# Patient Record
Sex: Male | Born: 1970 | Race: Black or African American | Hispanic: No | Marital: Married | Smoking: Current every day smoker
Health system: Southern US, Community
[De-identification: ages and names within clinical notes are randomized; demographics above are authoritative.]

## PROBLEM LIST (undated history)

## (undated) DIAGNOSIS — T7840XA Allergy, unspecified, initial encounter: Secondary | ICD-10-CM

## (undated) DIAGNOSIS — Z972 Presence of dental prosthetic device (complete) (partial): Secondary | ICD-10-CM

## (undated) DIAGNOSIS — C499 Malignant neoplasm of connective and soft tissue, unspecified: Secondary | ICD-10-CM

## (undated) DIAGNOSIS — I219 Acute myocardial infarction, unspecified: Secondary | ICD-10-CM

## (undated) DIAGNOSIS — C7951 Secondary malignant neoplasm of bone: Secondary | ICD-10-CM

## (undated) DIAGNOSIS — C801 Malignant (primary) neoplasm, unspecified: Secondary | ICD-10-CM

## (undated) DIAGNOSIS — J3489 Other specified disorders of nose and nasal sinuses: Secondary | ICD-10-CM

## (undated) HISTORY — DX: Secondary malignant neoplasm of bone: C79.51

## (undated) HISTORY — DX: Allergy, unspecified, initial encounter: T78.40XA

## (undated) HISTORY — DX: Malignant (primary) neoplasm, unspecified: C80.1

## (undated) HISTORY — DX: Acute myocardial infarction, unspecified: I21.9

## (undated) HISTORY — DX: Malignant neoplasm of connective and soft tissue, unspecified: C49.9

## (undated) HISTORY — PX: TUMOR EXCISION: SHX421

## (undated) HISTORY — PX: MULTIPLE TOOTH EXTRACTIONS: SHX2053

---

## 2002-07-02 ENCOUNTER — Encounter: Payer: Self-pay | Admitting: Emergency Medicine

## 2002-07-02 ENCOUNTER — Ambulatory Visit (HOSPITAL_BASED_OUTPATIENT_CLINIC_OR_DEPARTMENT_OTHER): Admission: RE | Admit: 2002-07-02 | Discharge: 2002-07-02 | Payer: Self-pay | Admitting: Orthopedic Surgery

## 2002-07-02 ENCOUNTER — Emergency Department (HOSPITAL_COMMUNITY): Admission: EM | Admit: 2002-07-02 | Discharge: 2002-07-02 | Payer: Self-pay | Admitting: Emergency Medicine

## 2006-07-22 ENCOUNTER — Emergency Department (HOSPITAL_COMMUNITY): Admission: EM | Admit: 2006-07-22 | Discharge: 2006-07-22 | Payer: Self-pay | Admitting: Emergency Medicine

## 2008-10-22 ENCOUNTER — Emergency Department (HOSPITAL_COMMUNITY): Admission: EM | Admit: 2008-10-22 | Discharge: 2008-10-22 | Payer: Self-pay | Admitting: Emergency Medicine

## 2009-06-12 ENCOUNTER — Emergency Department (HOSPITAL_COMMUNITY): Admission: EM | Admit: 2009-06-12 | Discharge: 2009-06-12 | Payer: Self-pay | Admitting: Family Medicine

## 2010-07-27 NOTE — Op Note (Signed)
NAMESELDON, Bruce Lynch NO.:  [id number]   MEDICAL RECORD NO.:  [id number]                   PATIENT TYPE:  AMB   LOCATION:  DSC                                  FACILITY:  [facility]   PHYSICIAN:  Katy Fitch. Naaman Plummer., M.D.          DATE OF BIRTH:  10-21-1970   DATE OF PROCEDURE:  07/02/2002  DATE OF DISCHARGE:  07/02/2002                                 OPERATIVE REPORT   PREOPERATIVE DIAGNOSIS:  Avulsion of pulp and nail avulsion of left long  finger due to industrial accident.   POSTOPERATIVE DIAGNOSES:  Avulsion of pulp and nail avulsion of left long  finger due to industrial accident, with identification of fracture of tuft  of distal phalanx, left long finger.   PROCEDURES:  Irrigation and debridement of wound, followed by V to Y  advancement flap for coverage of exposed distal phalanx, left long finger.   SURGEON:  Katy Fitch. Sypher, M.D.   ASSISTANT:  Jonni Sanger, P.A.   ANESTHESIA:  0.25% Marcaine and 2% lidocaine metacarpal head-level block.  Anesthesia provided by Katy Fitch. Sypher, M.D.   INDICATIONS:  The patient is a 40 year old who is employed in a  [employer].  Earlier today he caught his left long finger in a  machine and abruptly withdrew the finger, tearing off a portion of his nail  plate, distal pulp, and exposing the distal phalanx of his left long finger.   He was initially seen at [facility], subsequently referred to  the emergency room, and a hand surgery consult was requested.   Due to the immediate availability of the minor operating room at the [facility]  day surgery center, he was subsequently transferred from the emergency room  to the [facility] day surgery center for urgent care of his left long finger.   After informed consent, he is brought to the operating room at this time.   DESCRIPTION OF PROCEDURE:  The patient is brought to the operating room and  placed in the supine position on the  operating table.  Following informed  consent, we placed a 0.25% Marcaine and 2% lidocaine metacarpal head-level  block.  When anesthesia was satisfactory to the left long finger, the arm  was prepped with Betadine soap and solution and sterilely draped.  The left  long finger was exsanguinated with a gauze wrap and a half-inch Penrose  drain was placed at the base of the finger as a digital tourniquet.   The wound was serially debrided of devitalized tissue, foreign material, and  irrigated thoroughly with sterile saline to cleanse the exposed distal  phalangeal tuft.   On the ulnar aspect of the finger there was some skin extending almost to  the ulnar nail fold.  There was a deficit of skin measuring more than 1 cm  on the radial aspect of the pulp.   A V to Y flap was created  utilizing primarily the vascular structures of the  radial proper digital artery.  This was subsequently undermined with release  of skin ligaments, with careful protection of the vascular structures to the  skin flap.   This was transposed distally 8 mm and inset with a mattress suture to the  nail.  The margins were then repaired with an advancing suture technique  with interrupted sutures of 5-0 nylon.   The donor defect was closed in a Y manner with a corner suture and  interrupted suture of 5-0 nylon.   A very cosmetic reconstruction of the fingertip was achieved.  Good coverage  of the bone with fat and skin was accomplished.   The wound was then dressed with Silvadene, Adaptic, sterile gauze, and  Coban.   For aftercare the patient is advised to elevate his hand thoroughly for the  next 48 hours, followed by taking great care to keep his fingertip dry for  the next seven days.   He is given prescriptions for Vicodin one to two tablets p.o. q.4-6h. p.r.n.  pain.  Antibiotics were not provided, as a portion of the wound is still  open.   There were no apparent complications.   We consulted  with his employer and advised the employer to either allow him  to do one-handed work with his left hand elevated or to allow him to be on  temporary disability for a minimum of 10 days until his sutures are removed.                                                Katy Fitch Naaman Plummer., M.D.    RVS/MEDQ  D:  07/02/2002  T:  07/05/2002  Job:  [phone]

## 2017-06-20 ENCOUNTER — Other Ambulatory Visit: Payer: Self-pay

## 2017-06-20 ENCOUNTER — Encounter: Payer: Self-pay | Admitting: Physician Assistant

## 2017-06-20 ENCOUNTER — Ambulatory Visit (INDEPENDENT_AMBULATORY_CARE_PROVIDER_SITE_OTHER): Payer: 59 | Admitting: Physician Assistant

## 2017-06-20 VITALS — BP 160/100 | HR 63 | Temp 98.8°F | Resp 16 | Ht 71.0 in | Wt 157.6 lb

## 2017-06-20 DIAGNOSIS — J339 Nasal polyp, unspecified: Secondary | ICD-10-CM

## 2017-06-20 DIAGNOSIS — Z1329 Encounter for screening for other suspected endocrine disorder: Secondary | ICD-10-CM

## 2017-06-20 LAB — GLUCOSE, POCT (MANUAL RESULT ENTRY): POC Glucose: 97 mg/dL (ref 70–99)

## 2017-06-20 MED ORDER — PREDNISONE 20 MG PO TABS: ORAL_TABLET | ORAL | 0 refills | Status: DC

## 2017-06-20 MED ORDER — FLUTICASONE PROPIONATE 50 MCG/ACT NA SUSP
2.0000 | Freq: Every day | NASAL | 12 refills | Status: DC
Start: 2017-06-20 — End: 2017-07-02

## 2017-06-20 NOTE — Patient Instructions (Addendum)
  Start taking Prednisone 98m x 5 days, then 253mx 5 days. Start Flonase nasal spray 2 sprays twice daily x 1 week.  You can also use saline nasal spray daily to help rinse your nasal passages.  You will receive a phone call next week to schedule an appointment with ear, nose and throat.   Go to the emergency department if your symptoms significantly worsen.   NEVER USE AFRIN FOR MORE THAN 3-4 DAYS IN A ROW.   Thank you for coming in today. I hope you feel we met your needs.  Feel free to call PCP if you have any questions or further requests.  Please consider signing up for MyChart if you do not already have it, as this is a great way to communicate with me.  Best,  Whitney McVey, PA-C   IF you received an x-ray today, you will receive an invoice from [facility]. Please contact [facility] at [phone] questions or concerns regarding your invoice.   IF you received labwork today, you will receive an invoice from [facility] at [phone] questions or concerns regarding your invoice.   Our billing staff will not be able to assist you with questions regarding bills from these companies.  You will be contacted with the lab results as soon as they are available. The fastest way to get your results is to activate your My Chart account. Instructions are located on the last page of this paperwork. If you have not heard from [place] the results in 2 weeks, please contact this office.

## 2017-06-20 NOTE — Progress Notes (Signed)
   Isael Stille  MRN: [id number] DOB: 11-27-1970  PCP: Patient, No Pcp Per  Subjective:  Pt is a 47 year old male who presents to clinic for nasal congestion and blurry eyes.  Nasal congestion x 2 weeks. Watery eyes x 1 week.  Endorses HA, breathing out of one nostril. Right nostril is significantly worse than his left.  He has been using Afrin for the past week and a half.  He had 2 episodes of dizziness and balance issues last week.  He is a current every day smoker.   Review of Systems  Constitutional: Negative for chills, diaphoresis, fatigue and fever.  HENT: Positive for congestion, postnasal drip, rhinorrhea, sinus pressure and sinus pain. Negative for ear discharge, ear pain, sneezing, sore throat, tinnitus and trouble swallowing.   Respiratory: Negative for cough, shortness of breath and wheezing.     There are no active problems to display for this patient.   No current outpatient medications on file prior to visit.   No current facility-administered medications on file prior to visit.     No Known Allergies   Objective:  BP (!) 196/94   Pulse 63   Temp 98.8 F (37.1 C) (Oral)   Resp 16   Ht 5\' 11"  (1.803 m)   Wt 157 lb 9.6 oz (71.5 kg)   SpO2 100%   BMI 21.98 kg/m   Physical Exam  Constitutional: He is oriented to person, place, and time. He appears well-developed and well-nourished.  HENT:  Right Ear: Tympanic membrane and ear canal normal.  Left Ear: Tympanic membrane and ear canal normal.  Nose: Right sinus exhibits no maxillary sinus tenderness and no frontal sinus tenderness. Left sinus exhibits no maxillary sinus tenderness and no frontal sinus tenderness.  Polyp of right nasal passage appearing to completely block passage. TTP lateral right nares Left nares is patent.   Eyes:  Right eye watering.   Pulmonary/Chest: Effort normal. No respiratory distress.  Neurological: He is alert and oriented to person, place, and time.  Skin: Skin is warm  and dry.  Psychiatric: He has a normal mood and affect. His behavior is normal. Judgment and thought content normal.  Vitals reviewed.   Results for orders placed or performed in visit on 06/20/17  POCT glucose (manual entry)  Result Value Ref Range   POC Glucose 97 70 - 99 mg/dl    Assessment and Plan :  1. Nasal polyposis - Ambulatory referral to ENT - fluticasone (FLONASE) 50 MCG/ACT nasal spray; Place 2 sprays into both nostrils daily.  Dispense: 16 g; Refill: 12 - predniSONE (DELTASONE) 20 MG tablet; Take 40mg  x 5 days, then 20mg  x 5 days.  Dispense: 15 tablet; Refill: 0 - Pt presents for worsening nasal congestion with recent history of long-term Afrin use. Suspect nasal polyposis 2/2 rebound congestion. Plan to refer to ENT for possible surgery, as his right nares is near completely blocked. Plan to treat with steroid. Emergency department precautions discussed.  2. Screening for endocrine disorder - POCT glucose (manual entry) - wnl  Mercer Pod, PA-C  [facility] [place] 06/20/2017 4:16 PM

## 2017-06-30 NOTE — H&P (Signed)
  HPI:   Bruce Lynch is a 47 y.o. male who presents as a new Patient.   Referring Provider: Self, A Referral  Chief complaint: Nasal and eye problem.  HPI: 76-month history of right side nasal obstruction and some bloody discharge. 3 or 4-week history of right side eye swelling and blurred vision. 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 He has not had any imaging and has not been treated with any antibiotics for this.  PMH/Meds/All/SocHx/FamHx/ROS:   History reviewed. No pertinent past medical history.  History reviewed. No pertinent surgical history.  No family history of bleeding disorders, wound healing problems or difficulty with anesthesia.   Social History   Social History  . Marital status: Married  Spouse name: N/A  . Number of children: N/A  . Years of education: N/A   Occupational History  . Not on file.   Social History Main Topics  . Smoking status: Current Every Day Smoker  . Smokeless tobacco: Never Used  . Alcohol use Not on file  . Drug use: Unknown  . Sexual activity: Not on file   Other Topics Concern  . Not on file   Social History Narrative  . No narrative on file   Current Outpatient Prescriptions:  . fluticasone propionate (FLONASE) 50 mcg/actuation nasal spray, SPRAY 2 SPRAYS INTO EACH NOSTRIL EVERY DAY, Disp: , Rfl: 12  A complete ROS was performed with pertinent positives/negatives noted in the HPI. The remainder of the ROS are negative.   Physical Exam:   Ht 1.829 m (6')  Wt 72.6 kg (160 lb)  BMI 21.70 kg/m   General: Healthy and alert, in no distress, breathing easily. Normal affect. In a pleasant mood. Head: Normocephalic, atraumatic. No masses, or scars. Eyes: Left eye normal and healthy. Right side with significant proptosis. The pupil is reactive on the right. He does have restricted gaze towards the right lateral side. Ears: Ear canals are clear. Tympanic membranes are  intact, with normal landmarks and the middle ears are clear and healthy. Hearing: Grossly normal. Nose: Left nasal cavity is clear. Septum looks healthy from the left. The right side is completely obstructed by a fleshy mass right at the nasal vestibular opening. Face: No masses or scars, facial nerve function is symmetric. Oral Cavity: No mucosal abnormalities are noted. Tongue with normal mobility. He has an upper denture but the palate appears healthy. Oropharynx: Tonsils are symmetric. There are no mucosal masses identified. Tongue base appears normal and healthy. Larynx/Hypopharynx: deferred Chest: Deferred Neck: Firm right level 1 and 2 adenopathy, no thyroid nodules or enlargement. Neuro: Cranial nerves II-XII with normal function. Balance: Normal gate. Other findings: none.  Independent Review of Additional Tests or Records:   CT reveals a large expansile mass involve entire right ethmoid and maxillary sinus and nasal cavity. There is some bony dehiscence along the lamina papyracea.  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 Recommend urgent CT of the sinuses to further evaluate this. We will discuss future workup following that.  CT reveals a large expansile mass, this is concerning for neoplasm, either inverting papilloma or carcinoma. Recommend we do biopsy of this in the operating room as soon as possible. We will schedule him within the next day or 2.

## 2017-07-01 ENCOUNTER — Telehealth: Payer: Self-pay

## 2017-07-01 ENCOUNTER — Other Ambulatory Visit: Payer: Self-pay

## 2017-07-01 ENCOUNTER — Encounter (HOSPITAL_COMMUNITY): Payer: Self-pay | Admitting: *Deleted

## 2017-07-01 NOTE — Progress Notes (Signed)
Pt denies SOB, chest pain, and being under the care of a cardiologist. Pt denies having a stress test, echo and cardiac cath. Pt denies having an EKG and chest x ray within the last year. Pt made aware to stop taking  Aspirin vitamins, fish oil and herbal medications. Do not take any NSAIDs ie: Ibuprofen, Advil, Naproxen, Aleve, Motrin, BC and Goody Powder. Pt verbalized understanding of all pre-op instructions.

## 2017-07-01 NOTE — Telephone Encounter (Signed)
Copied from [place] [id number]. Topic: Inquiry >> Jun 30, 2017  6:43 PM Oliver Pila B wrote: Reason for CRM: pt called to be contacted when the latest labs are released, call to advise

## 2017-07-02 ENCOUNTER — Ambulatory Visit (HOSPITAL_COMMUNITY): Payer: 59 | Admitting: Anesthesiology

## 2017-07-02 ENCOUNTER — Encounter (HOSPITAL_COMMUNITY): Payer: Self-pay | Admitting: *Deleted

## 2017-07-02 ENCOUNTER — Encounter (HOSPITAL_COMMUNITY)
Admission: RE | Disposition: A | Discharge status: Home / Self Care | Payer: Self-pay | Source: Ambulatory Visit | Attending: Otolaryngology

## 2017-07-02 ENCOUNTER — Ambulatory Visit (HOSPITAL_COMMUNITY)
Admission: RE | Admit: 2017-07-02 | Discharge: 2017-07-02 | Disposition: A | Discharge status: Home / Self Care | Payer: 59 | Source: Ambulatory Visit | Attending: Otolaryngology | Admitting: Otolaryngology

## 2017-07-02 DIAGNOSIS — C300 Malignant neoplasm of nasal cavity: Secondary | ICD-10-CM | POA: Diagnosis not present

## 2017-07-02 DIAGNOSIS — J3489 Other specified disorders of nose and nasal sinuses: Secondary | ICD-10-CM | POA: Diagnosis present

## 2017-07-02 DIAGNOSIS — F17200 Nicotine dependence, unspecified, uncomplicated: Secondary | ICD-10-CM | POA: Insufficient documentation

## 2017-07-02 HISTORY — DX: Other specified disorders of nose and nasal sinuses: J34.89

## 2017-07-02 HISTORY — DX: Presence of dental prosthetic device (complete) (partial): Z97.2

## 2017-07-02 HISTORY — PX: EXCISION NASAL MASS: SHX6271

## 2017-07-02 LAB — CBC
HEMATOCRIT: 44.2 % (ref 39.0–52.0)
HEMOGLOBIN: 15 g/dL (ref 13.0–17.0)
MCH: 33.6 pg (ref 26.0–34.0)
MCHC: 33.9 g/dL (ref 30.0–36.0)
MCV: 98.9 fL (ref 78.0–100.0)
Platelets: 205 10*3/uL (ref 150–400)
RBC: 4.47 MIL/uL (ref 4.22–5.81)
RDW: 15 % (ref 11.5–15.5)
WBC: 5.7 10*3/uL (ref 4.0–10.5)

## 2017-07-02 LAB — BASIC METABOLIC PANEL
ANION GAP: 10 (ref 5–15)
BUN: 11 mg/dL (ref 6–20)
CHLORIDE: 105 mmol/L (ref 101–111)
CO2: 24 mmol/L (ref 22–32)
Calcium: 9 mg/dL (ref 8.9–10.3)
Creatinine, Ser: 1.42 mg/dL — ABNORMAL HIGH (ref 0.61–1.24)
GFR calc Af Amer: >60 mL/min (ref >60)
GFR, EST NON AFRICAN AMERICAN: 57 mL/min — AB (ref >60)
GLUCOSE: 124 mg/dL — AB (ref 65–99)
POTASSIUM: 4.1 mmol/L (ref 3.5–5.1)
Sodium: 139 mmol/L (ref 135–145)

## 2017-07-02 SURGERY — EXCISION, MASS, NOSE
Anesthesia: General | Site: Nose | Laterality: Right

## 2017-07-02 MED ORDER — ROCURONIUM BROMIDE 100 MG/10ML IV SOLN
INTRAVENOUS | Status: DC | PRN
  Administered 2017-07-02: 50 mg via INTRAVENOUS

## 2017-07-02 MED ORDER — FENTANYL CITRATE (PF) 100 MCG/2ML IJ SOLN
INTRAMUSCULAR | Status: AC
  Filled 2017-07-02: qty 2

## 2017-07-02 MED ORDER — OXYCODONE HCL 5 MG PO TABS
5.0000 mg | ORAL_TABLET | Freq: Once | ORAL | Status: AC | PRN
  Administered 2017-07-02: 5 mg via ORAL

## 2017-07-02 MED ORDER — FENTANYL CITRATE (PF) 100 MCG/2ML IJ SOLN
INTRAMUSCULAR | Status: DC | PRN
  Administered 2017-07-02 (×2): 50 ug via INTRAVENOUS

## 2017-07-02 MED ORDER — OXYMETAZOLINE HCL 0.05 % NA SOLN
NASAL | Status: AC
  Filled 2017-07-02: qty 15

## 2017-07-02 MED ORDER — SUGAMMADEX SODIUM 200 MG/2ML IV SOLN
INTRAVENOUS | Status: DC | PRN
  Administered 2017-07-02: 143 mg via INTRAVENOUS

## 2017-07-02 MED ORDER — LACTATED RINGERS IV SOLN
INTRAVENOUS | Status: DC
  Administered 2017-07-02: 11:00:00 via INTRAVENOUS

## 2017-07-02 MED ORDER — LIDOCAINE-EPINEPHRINE 1 %-1:100000 IJ SOLN
INTRAMUSCULAR | Status: AC
  Filled 2017-07-02: qty 1

## 2017-07-02 MED ORDER — ONDANSETRON HCL 4 MG/2ML IJ SOLN: 4.0000 mg | Freq: Once | INTRAMUSCULAR | Status: DC | PRN

## 2017-07-02 MED ORDER — ONDANSETRON HCL 4 MG/2ML IJ SOLN
INTRAMUSCULAR | Status: AC
  Filled 2017-07-02: qty 2

## 2017-07-02 MED ORDER — PROPOFOL 10 MG/ML IV BOLUS
INTRAVENOUS | Status: DC | PRN
  Administered 2017-07-02: 150 mg via INTRAVENOUS

## 2017-07-02 MED ORDER — LIDOCAINE 2% (20 MG/ML) 5 ML SYRINGE
INTRAMUSCULAR | Status: AC
  Filled 2017-07-02: qty 5

## 2017-07-02 MED ORDER — OXYMETAZOLINE HCL 0.05 % NA SOLN
NASAL | Status: DC | PRN
  Administered 2017-07-02: 1 via TOPICAL

## 2017-07-02 MED ORDER — LACTATED RINGERS IV SOLN
INTRAVENOUS | Status: DC | PRN
  Administered 2017-07-02 (×2): via INTRAVENOUS

## 2017-07-02 MED ORDER — PHENYLEPHRINE 40 MCG/ML (10ML) SYRINGE FOR IV PUSH (FOR BLOOD PRESSURE SUPPORT)
PREFILLED_SYRINGE | INTRAVENOUS | Status: AC
  Filled 2017-07-02: qty 10

## 2017-07-02 MED ORDER — MIDAZOLAM HCL 2 MG/2ML IJ SOLN
INTRAMUSCULAR | Status: AC
  Filled 2017-07-02: qty 2

## 2017-07-02 MED ORDER — LIDOCAINE-EPINEPHRINE 1 %-1:100000 IJ SOLN
INTRAMUSCULAR | Status: DC | PRN
  Administered 2017-07-02: 4 mL

## 2017-07-02 MED ORDER — OXYCODONE HCL 5 MG PO TABS
ORAL_TABLET | ORAL | Status: AC
  Filled 2017-07-02: qty 1

## 2017-07-02 MED ORDER — LIDOCAINE HCL (CARDIAC) PF 100 MG/5ML IV SOSY
PREFILLED_SYRINGE | INTRAVENOUS | Status: DC | PRN
  Administered 2017-07-02: 30 mg via INTRAVENOUS

## 2017-07-02 MED ORDER — ONDANSETRON HCL 4 MG/2ML IJ SOLN
INTRAMUSCULAR | Status: DC | PRN
  Administered 2017-07-02: 4 mg via INTRAVENOUS

## 2017-07-02 MED ORDER — BACITRACIN ZINC 500 UNIT/GM EX OINT
TOPICAL_OINTMENT | CUTANEOUS | Status: AC
  Filled 2017-07-02: qty 28.35

## 2017-07-02 MED ORDER — OXYCODONE HCL 5 MG/5ML PO SOLN: 5.0000 mg | Freq: Once | ORAL | Status: AC | PRN

## 2017-07-02 MED ORDER — MIDAZOLAM HCL 5 MG/5ML IJ SOLN
INTRAMUSCULAR | Status: DC | PRN
  Administered 2017-07-02: 2 mg via INTRAVENOUS

## 2017-07-02 MED ORDER — FENTANYL CITRATE (PF) 250 MCG/5ML IJ SOLN
INTRAMUSCULAR | Status: AC
Start: 2017-07-02 — End: ?
  Filled 2017-07-02: qty 5

## 2017-07-02 MED ORDER — FENTANYL CITRATE (PF) 100 MCG/2ML IJ SOLN
25.0000 ug | INTRAMUSCULAR | Status: DC | PRN
  Administered 2017-07-02 (×2): 25 ug via INTRAVENOUS

## 2017-07-02 MED ORDER — DEXAMETHASONE SODIUM PHOSPHATE 10 MG/ML IJ SOLN
INTRAMUSCULAR | Status: DC | PRN
  Administered 2017-07-02: 10 mg via INTRAVENOUS

## 2017-07-02 MED ORDER — SUGAMMADEX SODIUM 500 MG/5ML IV SOLN
INTRAVENOUS | Status: AC
  Filled 2017-07-02: qty 5

## 2017-07-02 MED ORDER — HYDROCORTISONE NA SUCCINATE PF 250 MG IJ SOLR
INTRAMUSCULAR | Status: AC
  Filled 2017-07-02: qty 250

## 2017-07-02 SURGICAL SUPPLY — 31 items
ATTRACTOMAT 16X20 MAGNETIC DRP (DRAPES) IMPLANT
BLADE SURG 15 STRL LF DISP TIS (BLADE) IMPLANT
BLADE SURG 15 STRL SS (BLADE)
CANISTER SUCT 3000ML PPV (MISCELLANEOUS) ×3 IMPLANT
COAGULATOR SUCT 6 FR SWTCH (ELECTROSURGICAL)
COAGULATOR SUCT SWTCH 10FR 6 (ELECTROSURGICAL) IMPLANT
CONT SPEC 4OZ CLIKSEAL STRL BL (MISCELLANEOUS) IMPLANT
CRADLE DONUT ADULT HEAD (MISCELLANEOUS) ×3 IMPLANT
DRAPE HALF SHEET 40X57 (DRAPES) IMPLANT
DRESSING MEROCEL 8CM (GAUZE/BANDAGES/DRESSINGS) ×1 IMPLANT
DRSG MEROCEL 8CM (GAUZE/BANDAGES/DRESSINGS) ×3
DRSG TELFA 3X8 NADH (GAUZE/BANDAGES/DRESSINGS) ×3 IMPLANT
GAUZE SPONGE 2X2 8PLY STRL LF (GAUZE/BANDAGES/DRESSINGS) ×1 IMPLANT
GLOVE ECLIPSE 7.5 STRL STRAW (GLOVE) ×3 IMPLANT
GOWN STRL REUS W/ TWL LRG LVL3 (GOWN DISPOSABLE) ×2 IMPLANT
GOWN STRL REUS W/TWL LRG LVL3 (GOWN DISPOSABLE) ×6
KIT BASIN OR (CUSTOM PROCEDURE TRAY) ×3 IMPLANT
KIT TURNOVER KIT B (KITS) ×3 IMPLANT
NDL PRECISIONGLIDE 27X1.5 (NEEDLE) ×1 IMPLANT
NEEDLE PRECISIONGLIDE 27X1.5 (NEEDLE) ×3 IMPLANT
NS IRRIG 1000ML POUR BTL (IV SOLUTION) ×3 IMPLANT
PAD ARMBOARD 7.5X6 YLW CONV (MISCELLANEOUS) ×6 IMPLANT
PAD DRESSING TELFA 3X8 NADH (GAUZE/BANDAGES/DRESSINGS) ×1 IMPLANT
PATTIES SURGICAL .5 X3 (DISPOSABLE) ×3 IMPLANT
SPONGE GAUZE 2X2 STER 10/PKG (GAUZE/BANDAGES/DRESSINGS) ×2
SUT CHROMIC 4 0 P 3 18 (SUTURE) ×3 IMPLANT
SUT ETHILON 3 0 PS 1 (SUTURE) IMPLANT
SUT PLAIN 4 0 ~~LOC~~ 1 (SUTURE) ×3 IMPLANT
TOWEL OR 17X24 6PK STRL BLUE (TOWEL DISPOSABLE) ×3 IMPLANT
TRAY ENT MC OR (CUSTOM PROCEDURE TRAY) ×3 IMPLANT
WATER STERILE IRR 1000ML POUR (IV SOLUTION) IMPLANT

## 2017-07-02 NOTE — Anesthesia Procedure Notes (Signed)
Procedure Name: Intubation Date/Time: 07/02/2017 12:20 PM Performed by: Eligha Bridegroom, CRNA Pre-anesthesia Checklist: Patient identified, Emergency Drugs available, Suction available, Patient being monitored and Timeout performed Patient Re-evaluated:Patient Re-evaluated prior to induction Oxygen Delivery Method: Circle system utilized Preoxygenation: Pre-oxygenation with 100% oxygen Induction Type: IV induction Laryngoscope Size: Mac and 4 Grade View: Grade I Tube type: Oral Tube size: 7.5 mm Number of attempts: 1 Airway Equipment and Method: Stylet Placement Confirmation: ETT inserted through vocal cords under direct vision,  positive ETCO2 and breath sounds checked- equal and bilateral Secured at: 22 cm Tube secured with: Tape Dental Injury: Teeth and Oropharynx as per pre-operative assessment

## 2017-07-02 NOTE — Op Note (Signed)
OPERATIVE REPORT  DATE OF SURGERY: 07/02/2017  PATIENT:  Bruce Lynch,  47 y.o. male  PRE-OPERATIVE DIAGNOSIS:  Right Nasal Mass  POST-OPERATIVE DIAGNOSIS:  * No post-op diagnosis entered *  PROCEDURE:  Procedure(s): EXCISION NASAL MASS  SURGEON:  Beckie Salts, MD  ASSISTANTS: None   ANESTHESIA:   General   EBL: 40 ml  DRAINS: none  LOCAL MEDICATIONS USED: 1% Xylocaine with epinephrine  SPECIMEN: Right nasal/sinus mass  COUNTS:  Correct  PROCEDURE DETAILS: The patient was taken to the operating room and placed on the operating table in the supine position. Following induction of general endotracheal anesthesia, the face was draped in a standard fashion.  The 0 degree endoscope was used throughout the case.  The right nasal cavity was inspected and filled with a large soft tissue mass.  This mass was infiltrated with local anesthetic solution.  A straight Weil-Blakesley forceps was used to remove multiple large fragments of the specimen.  This was sent fresh for pathologic evaluation and lymphoma work-up.  Topical Afrin on pledgets were placed in the nasal cavity for hemostasis.  A vented Merocel pack was cut down to a smaller thickness and the tube was removed and was packed into the right nasal cavity.  This was then infiltrated with local anesthetic solution.  The patient was awakened extubated and transferred to recovery in stable condition.    PATIENT DISPOSITION:  To PACU, stable

## 2017-07-02 NOTE — Anesthesia Postprocedure Evaluation (Signed)
Anesthesia Post Note  Patient: Bruce Lynch  Procedure(s) Performed: EXCISION NASAL MASS (Right Nose)     Patient location during evaluation: PACU Anesthesia Type: General Level of consciousness: awake and alert Pain management: pain level controlled Vital Signs Assessment: post-procedure vital signs reviewed and stable Respiratory status: spontaneous breathing, nonlabored ventilation, respiratory function stable and patient connected to nasal cannula oxygen Cardiovascular status: blood pressure returned to baseline and stable Postop Assessment: no apparent nausea or vomiting Anesthetic complications: no    Last Vitals:  Vitals:   07/02/17 1432 07/02/17 1450  BP: (!) 150/93 (!) 153/99  Pulse: 65 (!) 56  Resp: 16 18  Temp: 36.9 C   SpO2: 99% 100%    Last Pain:  Vitals:   07/02/17 1450  TempSrc:   PainSc: 2                  Kailia Starry COKER

## 2017-07-02 NOTE — Interval H&P Note (Signed)
History and Physical Interval Note:  07/02/2017 11:58 AM  Bruce Lynch  has presented today for surgery, with the diagnosis of Right Nasal Mass  The various methods of treatment have been discussed with the patient and family. After consideration of risks, benefits and other options for treatment, the patient has consented to  Procedure(s) with comments: EXCISION NASAL MASS (Right) - Right nasal endoscopy with biopsy of right nasal mass as a surgical intervention .  The patient's history has been reviewed, patient examined, no change in status, stable for surgery.  I have reviewed the patient's chart and labs.  Questions were answered to the patient's satisfaction.     Izora Gala

## 2017-07-02 NOTE — Anesthesia Preprocedure Evaluation (Signed)
Anesthesia Evaluation  Patient identified by MRN, date of birth, ID band Patient awake    Reviewed: Allergy & Precautions, NPO status , Patient's Chart, lab work & pertinent test results  Airway Mallampati: II  TM Distance: >3 FB Neck ROM: Full    Dental  (+) Edentulous Upper, Partial Lower   Pulmonary Current Smoker,    breath sounds clear to auscultation       Cardiovascular  Rhythm:Regular Rate:Normal     Neuro/Psych    GI/Hepatic   Endo/Other    Renal/GU      Musculoskeletal   Abdominal   Peds  Hematology   Anesthesia Other Findings   Reproductive/Obstetrics                             Anesthesia Physical Anesthesia Plan  ASA: II  Anesthesia Plan: General   Post-op Pain Management:    Induction: Intravenous  PONV Risk Score and Plan: Ondansetron and Dexamethasone  Airway Management Planned: Oral ETT  Additional Equipment:   Intra-op Plan:   Post-operative Plan: Extubation in OR  Informed Consent: I have reviewed the patients History and Physical, chart, labs and discussed the procedure including the risks, benefits and alternatives for the proposed anesthesia with the patient or authorized representative who has indicated his/her understanding and acceptance.   Dental advisory given  Plan Discussed with: CRNA and Anesthesiologist  Anesthesia Plan Comments:         Anesthesia Quick Evaluation

## 2017-07-02 NOTE — Transfer of Care (Signed)
Immediate Anesthesia Transfer of Care Note  Patient: Bruce Lynch  Procedure(s) Performed: EXCISION NASAL MASS (Right Nose)  Patient Location: PACU  Anesthesia Type:General  Level of Consciousness: awake and alert   Airway & Oxygen Therapy: Patient Spontanous Breathing  Post-op Assessment: Report given to RN  Post vital signs: Reviewed and stable  Last Vitals:  Vitals Value Taken Time  BP 144/67 07/02/2017  1:03 PM  Temp    Pulse 56 07/02/2017  1:05 PM  Resp 15 07/02/2017  1:05 PM  SpO2 97 % 07/02/2017  1:05 PM  Vitals shown include unvalidated device data.  Last Pain:  Vitals:   07/02/17 1031  TempSrc:   PainSc: 7       Patients Stated Pain Goal: 4 (47/58/30 7460)  Complications: No apparent anesthesia complications

## 2017-07-03 ENCOUNTER — Encounter (HOSPITAL_COMMUNITY): Payer: Self-pay | Admitting: Otolaryngology

## 2017-07-08 ENCOUNTER — Emergency Department (HOSPITAL_COMMUNITY): Payer: 59

## 2017-07-08 ENCOUNTER — Emergency Department (HOSPITAL_COMMUNITY)
Admission: EM | Admit: 2017-07-08 | Discharge: 2017-07-08 | Disposition: A | Discharge status: Home / Self Care | Payer: 59 | Attending: Emergency Medicine | Admitting: Emergency Medicine

## 2017-07-08 ENCOUNTER — Encounter (HOSPITAL_COMMUNITY): Payer: Self-pay

## 2017-07-08 ENCOUNTER — Other Ambulatory Visit: Payer: Self-pay

## 2017-07-08 DIAGNOSIS — R600 Localized edema: Secondary | ICD-10-CM | POA: Diagnosis present

## 2017-07-08 DIAGNOSIS — D3161 Benign neoplasm of unspecified site of right orbit: Secondary | ICD-10-CM | POA: Insufficient documentation

## 2017-07-08 DIAGNOSIS — R220 Localized swelling, mass and lump, head: Secondary | ICD-10-CM

## 2017-07-08 DIAGNOSIS — J3489 Other specified disorders of nose and nasal sinuses: Secondary | ICD-10-CM | POA: Diagnosis not present

## 2017-07-08 DIAGNOSIS — F17210 Nicotine dependence, cigarettes, uncomplicated: Secondary | ICD-10-CM | POA: Insufficient documentation

## 2017-07-08 DIAGNOSIS — H0589 Other disorders of orbit: Secondary | ICD-10-CM

## 2017-07-08 LAB — COMPREHENSIVE METABOLIC PANEL
ALBUMIN: 4.2 g/dL (ref 3.5–5.0)
ALK PHOS: 73 U/L (ref 38–126)
ALT: 17 U/L (ref 17–63)
AST: 20 U/L (ref 15–41)
Anion gap: 13 (ref 5–15)
BILIRUBIN TOTAL: 0.6 mg/dL (ref 0.3–1.2)
BUN: 7 mg/dL (ref 6–20)
CALCIUM: 9.9 mg/dL (ref 8.9–10.3)
CO2: 28 mmol/L (ref 22–32)
Chloride: 100 mmol/L — ABNORMAL LOW (ref 101–111)
Creatinine, Ser: 1.24 mg/dL (ref 0.61–1.24)
GFR calc Af Amer: >60 mL/min (ref >60)
GFR calc non Af Amer: >60 mL/min (ref >60)
GLUCOSE: 143 mg/dL — AB (ref 65–99)
POTASSIUM: 3.5 mmol/L (ref 3.5–5.1)
Sodium: 141 mmol/L (ref 135–145)
TOTAL PROTEIN: 7.7 g/dL (ref 6.5–8.1)

## 2017-07-08 LAB — CBC WITH DIFFERENTIAL/PLATELET
BASOS ABS: 0 10*3/uL (ref 0.0–0.1)
Basophils Relative: 1 %
EOS PCT: 1 %
Eosinophils Absolute: 0 10*3/uL (ref 0.0–0.7)
HEMATOCRIT: 40.5 % (ref 39.0–52.0)
Hemoglobin: 13.7 g/dL (ref 13.0–17.0)
LYMPHS PCT: 38 %
Lymphs Abs: 2.4 10*3/uL (ref 0.7–4.0)
MCH: 33.3 pg (ref 26.0–34.0)
MCHC: 33.8 g/dL (ref 30.0–36.0)
MCV: 98.3 fL (ref 78.0–100.0)
MONOS PCT: 5 %
Monocytes Absolute: 0.3 10*3/uL (ref 0.1–1.0)
Neutro Abs: 3.4 10*3/uL (ref 1.7–7.7)
Neutrophils Relative %: 55 %
Platelets: 282 10*3/uL (ref 150–400)
RBC: 4.12 MIL/uL — ABNORMAL LOW (ref 4.22–5.81)
RDW: 14.5 % (ref 11.5–15.5)
WBC: 6.2 10*3/uL (ref 4.0–10.5)

## 2017-07-08 MED ORDER — VANCOMYCIN HCL 10 G IV SOLR
1500.0000 mg | Freq: Once | INTRAVENOUS | Status: AC
  Administered 2017-07-08: 1500 mg via INTRAVENOUS
  Filled 2017-07-08: qty 1500

## 2017-07-08 MED ORDER — LORAZEPAM 2 MG/ML IJ SOLN
1.0000 mg | Freq: Once | INTRAMUSCULAR | Status: AC
  Administered 2017-07-08: 1 mg via INTRAVENOUS
  Filled 2017-07-08: qty 1

## 2017-07-08 MED ORDER — HYDROCODONE-ACETAMINOPHEN 5-325 MG PO TABS
1.0000 | ORAL_TABLET | Freq: Once | ORAL | Status: AC
  Administered 2017-07-08: 1 via ORAL
  Filled 2017-07-08: qty 1

## 2017-07-08 MED ORDER — GADOBENATE DIMEGLUMINE 529 MG/ML IV SOLN
15.0000 mL | Freq: Once | INTRAVENOUS | Status: AC | PRN
  Administered 2017-07-08: 15 mL via INTRAVENOUS

## 2017-07-08 MED ORDER — SODIUM CHLORIDE 0.9 % IV SOLN
2.0000 g | Freq: Once | INTRAVENOUS | Status: AC
  Administered 2017-07-08: 2 g via INTRAVENOUS
  Filled 2017-07-08: qty 20

## 2017-07-08 MED ORDER — HYDROMORPHONE HCL 2 MG/ML IJ SOLN
1.0000 mg | Freq: Once | INTRAMUSCULAR | Status: AC
  Administered 2017-07-08: 1 mg via INTRAVENOUS
  Filled 2017-07-08: qty 1

## 2017-07-08 MED ORDER — METRONIDAZOLE IN NACL 5-0.79 MG/ML-% IV SOLN
500.0000 mg | Freq: Once | INTRAVENOUS | Status: AC
  Administered 2017-07-08: 500 mg via INTRAVENOUS
  Filled 2017-07-08: qty 100

## 2017-07-08 MED ORDER — IOHEXOL 300 MG/ML  SOLN
75.0000 mL | Freq: Once | INTRAMUSCULAR | Status: AC | PRN
  Administered 2017-07-08: 75 mL via INTRAVENOUS

## 2017-07-08 NOTE — ED Notes (Signed)
[facility] called to say they are sending for patient, Ativan given

## 2017-07-08 NOTE — ED Notes (Signed)
Pt still at MRI, 2 family members put in room to wait for pt to return

## 2017-07-08 NOTE — ED Notes (Signed)
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

## 2017-07-08 NOTE — ED Notes (Signed)
Patient transported to [facility] 

## 2017-07-08 NOTE — ED Notes (Signed)
[facility] arrived for transport 

## 2017-07-08 NOTE — ED Provider Notes (Signed)
[facility] EMERGENCY DEPARTMENT Provider Note   CSN: [id number] Arrival date & time: 07/08/17  1554     History   Chief Complaint Chief Complaint  Patient presents with  . Facial Swelling    HPI Isa Kohlenberg is a 47 y.o. male.  HPI    47 y.o. who presents from ophthalmology office for evaluation of right eye pain, swelling that is been ongoing for the last several months.  Wife states that initially 3 months ago, patient started complaining of symptoms consistent with sinusitis.  He was seen by an ED in [place] in urgent care in [place] diagnosed with sinusitis.  Patient reports that symptoms persisted and wife made an appointment with ENT.  Patient was seen by Dr. Constance Holster (ENT) for evaluation of sinus symptoms.  He had a nasal polyp removed that was biopsied.  Wife reports that over the last 2 weeks, he has had worsening pain, swelling to the right eye.  She notes that there is been drainage from the right eye.  He reports decreased vision secondary to symptoms.  Patient was referred to ophthalmology who saw him earlier and referred to ED because of concern for possible orbital cellulits .  Patient states he does not wear glasses or contacts.  He denies any preceding trauma, injury to eye. Denies fever.    Past Medical History:  Diagnosis Date  . Allergy   . Mass of nasal sinus   . Wears dentures     Patient Active Problem List   Diagnosis Date Noted  . 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 EXCISION NASAL MASS Right 07/02/2017   Procedure: EXCISION NASAL MASS;  Surgeon: Izora Gala, MD;  Location: [facility];  Service: ENT;  Laterality: Right;  Right nasal endoscopy with biopsy of right nasal mass  . MULTIPLE TOOTH EXTRACTIONS    . TUMOR EXCISION     parathyroidectomy        Home Medications    Prior to Admission medications   Medication Sig Start Date End Date Taking? Authorizing Provider  predniSONE  (DELTASONE) 20 MG tablet Take 40mg  x 5 days, then 20mg  x 5 days. Patient not taking: Reported on 06/30/2017 06/20/17   McVey, Gelene Mink, PA-C    Family History Family History  Problem Relation Age of Onset  . Diabetes Mother   . Heart attack Father     Social History Social History   Tobacco Use  . Smoking status: Current Every Day Smoker    Packs/day: 0.25    Types: Cigarettes  . Smokeless tobacco: Never Used  . Tobacco comment: pt to get a PCP to discuss options  Substance Use Topics  . Alcohol use: Yes    Alcohol/week: 0.6 oz    Types: 1 Standard drinks or equivalent per week    Comment: rare  . Drug use: Never     Allergies   Patient has no known allergies.   Review of Systems Review of Systems  All systems reviewed and negative, other than as noted in HPI.  Physical Exam Updated Vital Signs BP (!) 161/94   Pulse (!) 52   Temp 98.4 F (36.9 C) (Oral)   Resp 10   Ht 5\' 11"  (1.803 m)   Wt 71.2 kg (157 lb)   SpO2 100%   BMI 21.90 kg/m   Physical Exam  Constitutional: He is oriented to person, place, and time. He appears well-developed and well-nourished. No distress.  HENT:  Head: Normocephalic.  R eye proptosis. Conjunctivitis/chemosis with thin watery drainage. Severely limited eye movements R eye. Some adduction noted. Reports significant pain with attempted movement. States he can see my hand waving in front of him but can't count fingers.   Eyes: Right eye exhibits discharge. Left eye exhibits no discharge.  Neck: Neck supple.  Cardiovascular: Normal rate, regular rhythm and normal heart sounds. Exam reveals no gallop and no friction rub.  No murmur heard. Pulmonary/Chest: Effort normal and breath sounds normal. No respiratory distress.  Abdominal: Soft. He exhibits no distension. There is no tenderness.  Musculoskeletal: He exhibits no edema or tenderness.  Neurological: He is alert and oriented to person, place, and time.  CN intact aside  from deficits R eye.   Skin: Skin is warm and dry.  Psychiatric: He has a normal mood and affect. His behavior is normal. Thought content normal.  Nursing note and vitals reviewed.    ED Treatments / Results  Labs (all labs ordered are listed, but only abnormal results are displayed) Labs Reviewed  COMPREHENSIVE METABOLIC PANEL - Abnormal; Notable for the following components:      Result Value   Chloride 100 (*)    Glucose, Bld 143 (*)    All other components within normal limits  CBC WITH DIFFERENTIAL/PLATELET - Abnormal; Notable for the following components:   RBC 4.12 (*)    All other components within normal limits  CULTURE, BLOOD (ROUTINE X 2)  CULTURE, BLOOD (ROUTINE X 2)    EKG None  Radiology Mr Jeri Cos And Wo Contrast  Result Date: 07/08/2017 CLINICAL DATA:  RIGHT eye swelling for several months, sinusitis. Status post nasal polyps biopsy July 02, 2017. EXAM: MRI HEAD AND ORBITS WITHOUT AND WITH CONTRAST TECHNIQUE: Multiplanar, multiecho pulse sequences of the brain and surrounding structures were obtained without and with intravenous contrast. Multiplanar, multiecho pulse sequences of the orbits and surrounding structures were obtained including fat saturation techniques, before and after intravenous contrast administration. CONTRAST:  65mL MULTIHANCE GADOBENATE DIMEGLUMINE 529 MG/ML IV SOLN COMPARISON:  CT maxillofacial July 08, 2017 FINDINGS: MRI HEAD FINDINGS-mildly motion degraded examination. INTRACRANIAL CONTENTS: No reduced diffusion to suggest acute ischemia. No susceptibility artifact to suggest hemorrhage. The ventricles and sulci are normal for patient's age. No suspicious parenchymal signal, masses, mass effect. No abnormal intraparenchymal or extra-axial enhancement. No abnormal extra-axial fluid collections. Tumor extends marginally into the anterior cranial fossa through the cribriform plate and olfactory grooves, without parenchymal invasion. VASCULAR: Normal  major intracranial vascular flow voids present at skull base. SKULL AND UPPER CERVICAL SPINE: T1 bright signal with destructive changes of the upper clivus. OTHER: None. MRI ORBITS FINDINGS-moderate to severely motion degraded examination. ORBITS: Tumor extension from the RIGHT ethmoid sinuses into the RIGHT orbit with at least 4.1 by 1.8 cm component. RIGHT proptosis. Bowing of the RIGHT optic nerve sheath complex with RIGHT optic nerve edema. Tumor contacts the optic nerve most consistent with extra conal and retrobulbar invasion. Lateral displacement of the medial rectus and superior rectus muscles. Early LEFT medial orbital extension into extraconal fat. VISUALIZED SINUSES: Reduced diffusion, low ADC and decreased T2 signal of hypoenhancing sinonasal mass which invades the RIGHT orbit medially. Tumor and invades ethmoid and sphenoid sinuses, extending into RIGHT maxillary sinus with postobstructive frontal and sphenoid sinusitis. No definite extension into the cavernous sinuses though, motion degrades sensitivity. Effaced RIGHT sphenopalatine foramen. SOFT TISSUES: Reduced diffusion RIGHT neck associated with lymphadenopathy including rounded 12 mm RIGHT lateral pharyngeal lymph  node. IMPRESSION: 1. 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 2. 4.1 x 1.8 cm tumoral extension into medial RIGHT orbit stretching the RIGHT optic nerve with optic nerve edema. RIGHT proptosis. Rectus muscle displacement. 3. Tumoral invasion into anterior skull base and clivus without parenchymal invasion. No acute intracranial process. 4. Acute findings discussed with and reconfirmed by Dr.Elbridge Magowan on 07/08/2017 at 10:31 pm. Electronically Signed   By: Elon Alas M.D.   On: 07/08/2017 22:32   Ct Maxillofacial W Contrast  Addendum Date: 07/08/2017   ADDENDUM REPORT: 07/08/2017 21:56 ADDENDUM: RIGHT cervical lymphadenopathy may be metastatic  if sinonasal mass is tumor. Electronically Signed   By: Elon Alas M.D.   On: 07/08/2017 21:56   Result Date: 07/08/2017 CLINICAL DATA:  RIGHT eye pain and swelling for 3 days. Sinusitis for 2 months. Status post RIGHT nasal mass biopsy July 02, 2017. EXAM: CT MAXILLOFACIAL WITH CONTRAST TECHNIQUE: Multidetector CT imaging of the maxillofacial structures was performed with intravenous contrast. Multiplanar CT image reconstructions were also generated. CONTRAST:  31mL OMNIPAQUE IOHEXOL 300 MG/ML  SOLN COMPARISON:  None. FINDINGS: OSSEOUS: Invasive 4.8 by 4.9 x 5.7 cm sinonasal mass has destroyed the ethmoid air cells, sphenoid septum, posterior wall of the sphenoid sinus, olfactory grooves and fovea ethmoidalis into the planum sphenoidale. Destruction of the RIGHT medial orbital wall, osseous nasal septum, turbinates, RIGHT medial maxillary antrum. ORBITS: 1.3 x 4 x 3 cm mass extension and RIGHT medial orbit bowing the RIGHT medial rectus muscle, contiguous with the RIGHT optic nerve sheath complex compatible with retro-orbital extension of disease. Soft tissue mass extends to the RIGHT orbital apex. RIGHT proptosis. SINUSES: As above. Postobstructive frontal and sphenoid effusions. Mastoid air cells are well aerated. SOFT TISSUES: RIGHT level IIa 17 mm short axis lymph node, 2b 14 mm low-density lymph node effacing the internal jugular vein. Additional smaller RIGHT jugulodigastric lymph nodes. LIMITED INTRACRANIAL: Normal. IMPRESSION: 1. Large destructive invasive RIGHT sinonasal mass involving RIGHT orbit, RIGHT anterior and central skull base. Differential diagnosis includes tumor (SNUC, adenocarcinoma or lymphoma), less likely granulomatosis or invasive fungal sinusitis. By report this has been biopsied July 02, 2017 though pathology results not available at time of interpretation. Recommend MRI of the brain and face with contrast to evaluate intracranial extension. 2. Acute findings discussed with  and reconfirmed by PA TATYANA on 07/08/2017 at 7:23 pm. Electronically Signed: By: Elon Alas M.D. On: 07/08/2017 19:24   Mr Rosealee Albee CX Contrast  Result Date: 07/08/2017 CLINICAL DATA:  RIGHT eye swelling for several months, sinusitis. Status post nasal polyps biopsy July 02, 2017. EXAM: MRI HEAD AND ORBITS WITHOUT AND WITH CONTRAST TECHNIQUE: Multiplanar, multiecho pulse sequences of the brain and surrounding structures were obtained without and with intravenous contrast. Multiplanar, multiecho pulse sequences of the orbits and surrounding structures were obtained including fat saturation techniques, before and after intravenous contrast administration. CONTRAST:  46mL MULTIHANCE GADOBENATE DIMEGLUMINE 529 MG/ML IV SOLN COMPARISON:  CT maxillofacial July 08, 2017 FINDINGS: MRI HEAD FINDINGS-mildly motion degraded examination. INTRACRANIAL CONTENTS: No reduced diffusion to suggest acute ischemia. No susceptibility artifact to suggest hemorrhage. The ventricles and sulci are normal for patient's age. No suspicious parenchymal signal, masses, mass effect. No abnormal intraparenchymal or extra-axial enhancement. No abnormal extra-axial fluid collections. Tumor extends marginally into the anterior cranial fossa through the cribriform plate and olfactory grooves, without parenchymal invasion. VASCULAR: Normal major intracranial vascular flow voids present at skull base. SKULL AND UPPER CERVICAL  SPINE: T1 bright signal with destructive changes of the upper clivus. OTHER: None. MRI ORBITS FINDINGS-moderate to severely motion degraded examination. ORBITS: Tumor extension from the RIGHT ethmoid sinuses into the RIGHT orbit with at least 4.1 by 1.8 cm component. RIGHT proptosis. Bowing of the RIGHT optic nerve sheath complex with RIGHT optic nerve edema. Tumor contacts the optic nerve most consistent with extra conal and retrobulbar invasion. Lateral displacement of the medial rectus and superior rectus muscles.  Early LEFT medial orbital extension into extraconal fat. VISUALIZED SINUSES: Reduced diffusion, low ADC and decreased T2 signal of hypoenhancing sinonasal mass which invades the RIGHT orbit medially. Tumor and invades ethmoid and sphenoid sinuses, extending into RIGHT maxillary sinus with postobstructive frontal and sphenoid sinusitis. No definite extension into the cavernous sinuses though, motion degrades sensitivity. Effaced RIGHT sphenopalatine foramen. SOFT TISSUES: Reduced diffusion RIGHT neck associated with lymphadenopathy including rounded 12 mm RIGHT lateral pharyngeal lymph node. IMPRESSION: 1. 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 2. 4.1 x 1.8 cm tumoral extension into medial RIGHT orbit stretching the RIGHT optic nerve with optic nerve edema. RIGHT proptosis. Rectus muscle displacement. 3. Tumoral invasion into anterior skull base and clivus without parenchymal invasion. No acute intracranial process. 4. Acute findings discussed with and reconfirmed by Dr.Raymie Trani on 07/08/2017 at 10:31 pm. Electronically Signed   By: Elon Alas M.D.   On: 07/08/2017 22:32    Procedures Procedures (including critical care time)  Medications Ordered in ED Medications  vancomycin (VANCOCIN) 1,500 mg in sodium chloride 0.9 % 500 mL IVPB (has no administration in time range)  metroNIDAZOLE (FLAGYL) IVPB 500 mg (500 mg Intravenous New Bag/Given 07/08/17 1852)  HYDROcodone-acetaminophen (NORCO/VICODIN) 5-325 MG per tablet 1 tablet (1 tablet Oral Given 07/08/17 1619)  cefTRIAXone (ROCEPHIN) 2 g in sodium chloride 0.9 % 100 mL IVPB (0 g Intravenous Stopped 07/08/17 1932)  HYDROmorphone (DILAUDID) injection 1 mg (1 mg Intravenous Given 07/08/17 1802)  iohexol (OMNIPAQUE) 300 MG/ML solution 75 mL (75 mLs Intravenous Contrast Given 07/08/17 1824)     Initial Impression / Assessment and Plan / ED Course  I have reviewed the  triage vital signs and the nursing notes.  Pertinent labs & imaging results that were available during my care of the patient were reviewed by me and considered in my medical decision making (see chart for details).     46yM with progressively worsening R eye/facial pain. CT today concerning for malignancy. Pt had biopsy of nasal mass on 07/02/17. I cannot readily locate this pathology report though. Will MRI. Antibiotics now although infectious process seems less likely based on imaging. Denies fevers. Afebrile in ED. No leukocytosis. Vancomycin. Rocephin for because extends to skull base. Flagyl for anaerobic coverage.   Pt already seen today by Dr Manuella Ghazi, ophthalmology, who mentioned in his note transfer to [facility] if needs surgical care. I also curbsided Dr Vertell Limber, neurosurgery. After reviewing CT, he agrees that pt would need multidisciplinary surgical care beyond what we can provide at [facility]. Will MR and then discuss with ENT at [facility].   Discussed with Dr Minda Ditto at [facility]. With pt's rapid deterioration in vision, I feel he needs emergent evaluation. Will transfer ED to ED.   Final Clinical Impressions(s) / ED Diagnoses   Final diagnoses:  Mass of sinus  Orbital mass    ED Discharge Orders    None       Virgel Manifold, MD 07/08/17 2250

## 2017-07-08 NOTE — ED Triage Notes (Signed)
Pt presents with right eye swollen shut. Wife at bedside states this has been going on for about one month. Pt also has an area in the nose the has been biopsied, saw MD yesterday and pt was told results have not come in yet. Pt did have bandages removed from nose.

## 2017-07-08 NOTE — ED Provider Notes (Signed)
Patient placed in Quick Look pathway, seen and evaluated   Chief Complaint: Eye pain, Swelling  HPI:   47 y.o. who presents from ophthalmology office for evaluation of right eye pain, swelling that is been ongoing for the last several months.  Wife states that initially 3 months ago, patient started complaining of symptoms consistent with sinusitis.  He was seen by an ED in [place] in urgent care in [place] diagnosed with sinusitis.  Patient reports that symptoms persisted and wife made an appointment with ENT.  Patient was seen by Dr. Constance Holster (ENT) for evaluation of sinus symptoms.  He had a nasal polyp removed that was biopsied.  Wife reports that over the last 2 weeks, he has had worsening pain, swelling to the right eye.  She notes that there is been drainage from the right eye.  He reports decreased vision secondary to symptoms.  Patient was referred to an eye doctor who he saw today.  Patient was evaluated at the neurologist office and was sent to the ED for evaluation of orbital cellulitis with possible abscess.  Patient states he does not wear glasses or contacts.  He denies any preceding trauma, injury to eye.  White denies any fevers, vomiting.  ROS: Eye pain, swelling   Physical Exam:   Gen: No distress  Neuro: Awake and Alert  Skin: Warm    Focused Exam: Right periorbital swelling, edema, erythema.  Tenderness noted to the right periorbital region.  There is purulent with drainage from the right eye. Proptosis of the right eye noted. EOMs of left eye intact without any difficulty.  Patient has pain when attempting to look up.  4:17 PM: Notified Charge RN that patient needed a room in the main ED.   Initiation of care has begun. The patient has been counseled on the process, plan, and necessity for staying for the completion/evaluation, and the remainder of the medical screening examination    Desma Mcgregor 07/08/17 1710    Mesner, Corene Cornea, MD 07/09/17 0009

## 2017-07-08 NOTE — ED Notes (Signed)
Family requesting update

## 2017-07-08 NOTE — ED Notes (Signed)
Patient transported to CT 

## 2017-07-08 NOTE — ED Notes (Signed)
Report given to [place]

## 2017-07-09 MED ORDER — TIMOLOL MALEATE 0.5 % OP SOLN
1.00 | OPHTHALMIC | Status: DC
Start: 2017-07-09 — End: 2017-07-09

## 2017-07-09 MED ORDER — DEXAMETHASONE SODIUM PHOSPHATE 4 MG/ML IJ SOLN
10.00 | INTRAMUSCULAR | Status: DC
Start: 2017-07-09 — End: 2017-07-09

## 2017-07-09 MED ORDER — BRIMONIDINE TARTRATE 0.1 % OP SOLN
1.00 | OPHTHALMIC | Status: DC
Start: 2017-07-09 — End: 2017-07-09

## 2017-07-13 LAB — CULTURE, BLOOD (ROUTINE X 2)
CULTURE: NO GROWTH
CULTURE: NO GROWTH
Special Requests: ADEQUATE

## 2017-07-17 ENCOUNTER — Encounter (HOSPITAL_COMMUNITY): Payer: Self-pay

## 2017-07-21 ENCOUNTER — Encounter (HOSPITAL_COMMUNITY): Payer: Self-pay

## 2018-05-19 ENCOUNTER — Telehealth: Payer: Self-pay | Admitting: Oncology

## 2018-05-19 NOTE — Telephone Encounter (Signed)
A new patient appt has been rescheduled for the pt to see Dr. Alen Blew on 3/27 at 2pm. He's a current pt at [facility] and already scheduled for treatment on 3/20. I spoke to Rosa Sanchez at [name] office and let her know that I his appt has been rescheduled. [name] has been made of the new appt date and time.

## 2018-05-29 ENCOUNTER — Ambulatory Visit: Payer: 59 | Admitting: Oncology

## 2018-06-05 ENCOUNTER — Inpatient Hospital Stay: Payer: 59 | Attending: Oncology | Admitting: Oncology

## 2018-06-05 ENCOUNTER — Other Ambulatory Visit: Payer: Self-pay | Admitting: Oncology

## 2018-06-05 ENCOUNTER — Other Ambulatory Visit: Payer: Self-pay

## 2018-06-05 VITALS — BP 123/83 | HR 100 | Temp 98.5°F | Resp 18 | Ht 71.0 in | Wt 136.1 lb

## 2018-06-05 DIAGNOSIS — Z7289 Other problems related to lifestyle: Secondary | ICD-10-CM

## 2018-06-05 DIAGNOSIS — C770 Secondary and unspecified malignant neoplasm of lymph nodes of head, face and neck: Secondary | ICD-10-CM

## 2018-06-05 DIAGNOSIS — R918 Other nonspecific abnormal finding of lung field: Secondary | ICD-10-CM

## 2018-06-05 DIAGNOSIS — C499 Malignant neoplasm of connective and soft tissue, unspecified: Secondary | ICD-10-CM

## 2018-06-05 DIAGNOSIS — C490 Malignant neoplasm of connective and soft tissue of head, face and neck: Secondary | ICD-10-CM

## 2018-06-05 DIAGNOSIS — C7951 Secondary malignant neoplasm of bone: Secondary | ICD-10-CM

## 2018-06-05 DIAGNOSIS — G893 Neoplasm related pain (acute) (chronic): Secondary | ICD-10-CM | POA: Diagnosis not present

## 2018-06-05 DIAGNOSIS — Z79891 Long term (current) use of opiate analgesic: Secondary | ICD-10-CM

## 2018-06-05 DIAGNOSIS — M25551 Pain in right hip: Secondary | ICD-10-CM

## 2018-06-05 DIAGNOSIS — F17210 Nicotine dependence, cigarettes, uncomplicated: Secondary | ICD-10-CM

## 2018-06-05 NOTE — Progress Notes (Signed)
Reason for the request:   Rhabdomyosarcoma  HPI: I was asked by Dr. Angelina Ok to evaluate Bruce Lynch for advanced sarcoma.  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  At that time he had presented with bloody nasal discharge in January 2019 and subsequently developed ocular symptoms eye swelling and pressure associated with diplopia.  Brain MRI on July 08, 2017 showed a large invasive hypercellular sinonasal tumor of the nasal cavity extending into the right orbit.  Epidural extension into the clivus was noted.  On Jul 09, 2017 had a CT scan of the chest at [facility] where he has been receiving his care showed pulmonary nodules and biopsy on Jul 10, 2017 confirmed the presence of alveolar rhabdomyosarcoma.  PET CT scan on Jul 14, 2017 showed a large hypermetabolic mass in the sinonasal area.  Bilateral retropharyngeal cervical lymph node were noted.  Lumbar puncture a bone marrow biopsy where negative.  He has been receiving treatment since that time and received the following treatments and events:  1.  Radiation therapy started on 07/15/17 with concurrent chemotherapy ([place]) initiated by Dr. Angelina Ok 07/18/17 (inpatient) with actinomycin held during RT. 2. C1D15 V held d/t neutropenia. C2D1 VC with concurrent RT 08/07/17 (dose reduced 25%). C2D8 V (25% dose reduced) 08/15/17. [id number] 08/22/17. C3D1 VAC 08/29/17 (full dose). C3D8 omitted 2/2 FN. Admitted to [facility] 09/04/17. 3. Admitted to [facility] from 09/04/17 to 09/10/17 for febrile neutropenia. C3D15 vincristine held d/t recent hospitalization.  4. 09/23/17 MRI orbits revealing marked interval decrease in size of sinonasal tumor with resolved mass effect on the orbit; extensive fluid and mucosal thickening remain in the paranasal sinuses.  5. C4D1 (25% dose reduced 2/2 FN w/C3) 09/25/17. C4D8 10/01/17 (50% dose reduced d/t difficulty tolerating day 1 treatments - fatigue,  anorexia). [id number] 10/08/17. C5D1 10/15/17. C5D8 10/22/17. [id number] 10/30/17. Chemotherapy held d/t severe fatigue, poor PS. 6. PET/CT 01/29/18 revealing new osseous metastatic disease. Underwent SBRT to right pubis, left pubis, left acetabulum 02/2018 7. PET/CT 05/05/18 revealing new multifocal hypermetabolic osseous lesions in the left humerus, anterior right approximate 4th rib, sternum, pelvis, L3-5 vertebrae and the left femur are concerning for new osseous metastases. Interval decreased FDG activity in the region of the right posterior nasal cavity with a few now less well defined foci of lower level FDG activity that remain suspicious for residual / recurrent disease versus inflammatory changes; recommend attention on follow-up. 8. Admitted to [facility] 03/07-03/08 for neoplasm related pain. MRI L-spine 05/16/18 revealing diffuse marrow replacement with metastatic disease throughout the lumbar spine and upper sacrum. No pathological fracture. Predominantly ventral epidural disease resulting in moderate spinal canal narrowing at L4 and severe spinal canal narrowing at L5 and S1. 9. Cyclophosphamide/Topotecan initiated 05/18/18.  He is scheduled for cycle 2 of therapy on June 08, 2018.  Clinically, he reports of feeling reasonably well with overall pain reasonably managed.  His pain is predominantly in the right hip but no other complaints.  He is ambulating without any difficulties and attending to activities of daily living.  He has tolerated the last cycle of chemotherapy without any issues.  He denies any nausea or infusion related complications.  He is interesting of establishing care locally for convenience of chemotherapy administration.    He does not report any headaches, blurry vision, syncope or seizures. Does not report any fevers, chills or sweats.  Does not report any cough, wheezing or hemoptysis.  Does not report any chest pain,  palpitation, orthopnea or leg edema.  Does not report any nausea,  vomiting or abdominal pain.  Does not report any constipation or diarrhea.  Does not report any skeletal complaints.    Does not report frequency, urgency or hematuria.  Does not report any skin rashes or lesions. Does not report any heat or cold intolerance.  Does not report any lymphadenopathy or petechiae.  Does not report any anxiety or depression.  Remaining review of systems is negative.    Past Medical History:  Diagnosis Date  . Allergy   . Mass of nasal sinus   . Wears dentures   :  Past Surgical History:  Procedure Laterality Date  . EXCISION NASAL MASS Right 07/02/2017   Procedure: EXCISION NASAL MASS;  Surgeon: Izora Gala, MD;  Location: [facility];  Service: ENT;  Laterality: Right;  Right nasal endoscopy with biopsy of right nasal mass  . MULTIPLE TOOTH EXTRACTIONS    . TUMOR EXCISION     parathyroidectomy  :   Current Outpatient Medications:  bisacodyl (DULCOLAX) 5 mg EC tablet Take 10 mg by mouth once daily as needed for Constipation  . carboxymethylcellulose (REFRESH TEARS) 0.5 % ophthalmic solution Place 1-2 drops into both eyes 3 (three) times a day 30 mL 1  . dexAMETHasone (DECADRON) 4 MG tablet Take 29m (2 x 470mtablets) by mouth in the morning for 2 days after chemotherapy, then as directed. 30 tablet 3  . gabapentin (NEURONTIN) 300 MG capsule Take 1 capsule (300 mg total) by mouth 3 (three) times daily 90 capsule 11  . glucose chew tablet 4 gram chewable tablet Take 4 tablets (16 g total) by mouth as needed for Low blood sugar 50 tablet 0  . lidocaine (LIDODERM) 5 % patch Place 2 patches onto the skin daily for 30 days Apply patch to the most painful area for up to 12 hours in a 24 hour period. 60 patch 0  . lidocaine (XYLOCAINE) 2 % solution Swish and swallow 5 mLs 4 (four) times daily as needed for Pain Can dilute in water; use before meals for sore mouth/throat 300 mL 3  . morphine (MS CONTIN) 15 MG ER tablet Take 2 tablets (30 mg total) by mouth every 12 (twelve)  hours for 30 days 120 tablet 0  . oxyCODONE (ROXICODONE) 5 MG immediate release tablet Take 2 tablets (10 mg total) by mouth every 4 (four) hours as needed for up to 30 days 360 tablet 0  . pantoprazole (PROTONIX) 40 MG DR tablet Take 1 tablet (40 mg total) by mouth once daily 30 tablet 11  . prochlorperazine (COMPAZINE) 10 MG tablet Take 1 tablet (10 mg total) by mouth every 6 (six) hours as needed for Nausea or Vomiting for up to 30 doses 30 tablet 3  . sennosides-docusate (SENOKOT-S) 8.6-50 mg tablet Take 2 tablets by mouth once daily for 30 days 60 tablet 0  . latanoprost (XALATAN) 0.005 % ophthalmic solution Place 1 drop into both eyes nightly 2.5 mL 0     No Known Allergies:  Family History  Problem Relation Age of Onset  . Diabetes Mother   . Heart attack Father   :  Social History   Socioeconomic History  . Marital status: Married    Spouse name: Not on file  . Number of children: Not on file  . Years of education: Not on file  . Highest education level: Not on file  Occupational History  . Not on file  Social Needs  .  Financial resource strain: Not on file  . Food insecurity:    Worry: Not on file    Inability: Not on file  . Transportation needs:    Medical: Not on file    Non-medical: Not on file  Tobacco Use  . Smoking status: Current Every Day Smoker    Packs/day: 0.25    Types: Cigarettes  . Smokeless tobacco: Never Used  . Tobacco comment: pt to get a PCP to discuss options  Substance and Sexual Activity  . Alcohol use: Yes    Alcohol/week: 1.0 standard drinks    Types: 1 Standard drinks or equivalent per week    Comment: rare  . Drug use: Never  . Sexual activity: Not on file  Lifestyle  . Physical activity:    Days per week: Not on file    Minutes per session: Not on file  . Stress: Not on file  Relationships  . Social connections:    Talks on phone: Not on file    Gets together: Not on file    Attends religious service: Not on file    Active  member of club or organization: Not on file    Attends meetings of clubs or organizations: Not on file    Relationship status: Not on file  . Intimate partner violence:    Fear of current or ex partner: Not on file    Emotionally abused: Not on file    Physically abused: Not on file    Forced sexual activity: Not on file  Other Topics Concern  . Not on file  Social History Narrative  . Not on file  :  Pertinent items are noted in HPI.  Exam: Blood pressure 123/83, pulse 100, temperature 98.5 F (36.9 C), temperature source Oral, resp. rate 18, height 5' 11"  (1.803 m), weight 136 lb 1.6 oz (61.7 kg), SpO2 100 %.  ECOG 1  General appearance: alert and cooperative appeared without distress. Head: atraumatic without any abnormalities. Eyes: conjunctivae/corneas clear. PERRL.  Sclera anicteric. Throat: lips, mucosa, and tongue normal; without oral thrush or ulcers. Resp: clear to auscultation bilaterally without rhonchi, wheezes or dullness to percussion. Cardio: regular rate and rhythm, S1, S2 normal, no murmur, click, rub or gallop GI: soft, non-tender; bowel sounds normal; no masses,  no organomegaly Skin: Skin color, texture, turgor normal. No rashes or lesions Lymph nodes: Cervical, supraclavicular, and axillary nodes normal. Neurologic: Grossly normal without any motor, sensory or deep tendon reflexes. Musculoskeletal: No joint deformity or effusion.  CBC    Component Value Date/Time   WBC 6.2 07/08/2017 1615   RBC 4.12 (L) 07/08/2017 1615   HGB 13.7 07/08/2017 1615   HCT 40.5 07/08/2017 1615   PLT 282 07/08/2017 1615   MCV 98.3 07/08/2017 1615   MCH 33.3 07/08/2017 1615   MCHC 33.8 07/08/2017 1615   RDW 14.5 07/08/2017 1615   LYMPHSABS 2.4 07/08/2017 1615   MONOABS 0.3 07/08/2017 1615   EOSABS 0.0 07/08/2017 1615   BASOSABS 0.0 07/08/2017 1615     Chemistry      Component Value Date/Time   NA 141 07/08/2017 1615   K 3.5 07/08/2017 1615   CL 100 (L) 07/08/2017  1615   CO2 28 07/08/2017 1615   BUN 7 07/08/2017 1615   CREATININE 1.24 07/08/2017 1615      Component Value Date/Time   CALCIUM 9.9 07/08/2017 1615   ALKPHOS 73 07/08/2017 1615   AST 20 07/08/2017 1615   ALT 17 07/08/2017 1615  BILITOT 0.6 07/08/2017 1615       Assessment and Plan:   48 year old man with the following:  1.  Right sinonasal/parameningeal alveolar rhabdomyosarcoma diagnosed in May 2019.  He presented with nasal discharge and subsequently developed eye pressure and right ptosis.  He has advanced disease with bone involvement as well as pulmonary nodules.  He is status post chemotherapy and radiation outlined above and currently receiving Cytoxan and CPT-11 on daily basis for 5 days every 3 to 4 weeks.  He has tolerated the first cycle without any complaints and currently receiving it at [facility].  His disease status was updated today and logistics of continuing chemotherapy administration locally was reviewed today.  Complication associated with this chemotherapy was also discussed.  He is due for the next cycle of chemotherapy on March 30 and potentially his next cycle could start around April 20.  I have urged him to keep his appointments at [facility] as is for the time being till we are sure were able to create this regimen for him to be administered locally.  I will discuss this with pharmacy and the epic oncology team for this regimen built as soon as possible.  This might be delayed because of logistical reasons and for that reason I asked him to keep his appointments as is still we are able for sure to administer to this regimen safely and effectively.  He understands the logistics associated with administration of this chemotherapy and we will contact him once we are cleared to do so.  All his questions were answered to his satisfaction.  2.  IV access: Port-A-Cath is in place and used regularly without any issues.  3.   Antiemetics: Available to him without any issues with this regimen.  4.  Pain: Manageable with MS Contin and oxycodone breakthrough.  5.  Follow-up: To be determined pending the availability of this regimen locally.  60  minutes was spent with the patient face-to-face today.  More than 50% of time was spent on reviewing his medical records, imaging studies, laboratory data, discussing the logistics of chemotherapy administration, complications related to chemotherapy and answering questions for him and his wife over the phone today.     Thank you for the referral.  A copy of this consult has been forwarded to the requesting physician.

## 2018-06-08 ENCOUNTER — Telehealth: Payer: Self-pay | Admitting: Oncology

## 2018-06-08 NOTE — Telephone Encounter (Signed)
No los per 03/27. °

## 2018-06-19 ENCOUNTER — Encounter (HOSPITAL_COMMUNITY): Payer: Self-pay

## 2018-06-19 ENCOUNTER — Emergency Department (HOSPITAL_COMMUNITY): Payer: 59

## 2018-06-19 ENCOUNTER — Emergency Department (HOSPITAL_COMMUNITY)
Admission: EM | Admit: 2018-06-19 | Discharge: 2018-06-20 | Disposition: A | Discharge status: Home / Self Care | Payer: 59 | Attending: Emergency Medicine | Admitting: Emergency Medicine

## 2018-06-19 DIAGNOSIS — C499 Malignant neoplasm of connective and soft tissue, unspecified: Secondary | ICD-10-CM | POA: Diagnosis not present

## 2018-06-19 DIAGNOSIS — R0789 Other chest pain: Secondary | ICD-10-CM | POA: Diagnosis present

## 2018-06-19 DIAGNOSIS — R112 Nausea with vomiting, unspecified: Secondary | ICD-10-CM | POA: Insufficient documentation

## 2018-06-19 DIAGNOSIS — F17210 Nicotine dependence, cigarettes, uncomplicated: Secondary | ICD-10-CM | POA: Diagnosis not present

## 2018-06-19 DIAGNOSIS — R072 Precordial pain: Secondary | ICD-10-CM | POA: Diagnosis not present

## 2018-06-19 DIAGNOSIS — C7951 Secondary malignant neoplasm of bone: Secondary | ICD-10-CM | POA: Diagnosis not present

## 2018-06-19 LAB — COMPREHENSIVE METABOLIC PANEL
ALT: 16 U/L (ref 0–44)
AST: 20 U/L (ref 15–41)
Albumin: 3.8 g/dL (ref 3.5–5.0)
Alkaline Phosphatase: 92 U/L (ref 38–126)
Anion gap: 7 (ref 5–15)
BUN: 22 mg/dL — ABNORMAL HIGH (ref 6–20)
CO2: 25 mmol/L (ref 22–32)
Calcium: 8.7 mg/dL — ABNORMAL LOW (ref 8.9–10.3)
Chloride: 103 mmol/L (ref 98–111)
Creatinine, Ser: 1.1 mg/dL (ref 0.61–1.24)
GFR calc Af Amer: >60 mL/min (ref >60)
GFR calc non Af Amer: >60 mL/min (ref >60)
Glucose, Bld: 190 mg/dL — ABNORMAL HIGH (ref 70–99)
Potassium: 4.1 mmol/L (ref 3.5–5.1)
Sodium: 135 mmol/L (ref 135–145)
Total Bilirubin: 0.6 mg/dL (ref 0.3–1.2)
Total Protein: 6.5 g/dL (ref 6.5–8.1)

## 2018-06-19 LAB — CBC
HCT: 30.5 % — ABNORMAL LOW (ref 39.0–52.0)
Hemoglobin: 10.3 g/dL — ABNORMAL LOW (ref 13.0–17.0)
MCH: 32.9 pg (ref 26.0–34.0)
MCHC: 33.8 g/dL (ref 30.0–36.0)
MCV: 97.4 fL (ref 80.0–100.0)
Platelets: 170 10*3/uL (ref 150–400)
RBC: 3.13 MIL/uL — ABNORMAL LOW (ref 4.22–5.81)
RDW: 17.8 % — ABNORMAL HIGH (ref 11.5–15.5)
WBC: 6.1 10*3/uL (ref 4.0–10.5)
nRBC: 0 % (ref 0.0–0.2)

## 2018-06-19 LAB — D-DIMER, QUANTITATIVE: D-Dimer, Quant: 9.84 ug/mL-FEU — ABNORMAL HIGH (ref 0.00–0.50)

## 2018-06-19 LAB — TROPONIN I: Troponin I: <0.03 ng/mL (ref <0.03)

## 2018-06-19 MED ORDER — IOHEXOL 350 MG/ML SOLN
75.0000 mL | Freq: Once | INTRAVENOUS | Status: AC | PRN
  Administered 2018-06-19: 21:00:00 75 mL via INTRAVENOUS

## 2018-06-19 NOTE — Progress Notes (Signed)
Chaplain responded to STEMI call at 7:50 PM Patient is in cancer treatment complaining to chaplain of chest pains. Chaplain provided ministry of presence as pt arrived and taken to Trauma C.  EMT says family is aware patient is here. Will be available. Tamsen Snider Pager [phone]

## 2018-06-19 NOTE — ED Triage Notes (Signed)
Pt comes via [facility], hx of cancer, currently getting chemo, started getting CP, sudden onset, central, with n/v, SOB, no cardiac hx, PTA received 324 ASA, 4mg  zofran and one nitro with relief of pain.

## 2018-06-19 NOTE — ED Notes (Signed)
Cancelled Stemi--Bruce Lynch

## 2018-06-19 NOTE — ED Provider Notes (Signed)
[facility] EMERGENCY DEPARTMENT Provider Note   CSN: [id number] Arrival date & time: 06/19/18  1953    History   Chief Complaint Chief Complaint  Patient presents with  . Chest Pain    HPI Ivann Trimarco is a 48 y.o. male.     HPI Patient is a 48 year old male presents the emergency department complaints of sharp sudden central chest discomfort with associated nausea vomiting.  No shortness of breath.  No prior history of cardiac issues.  He does have a history of active alveolar rhabdomyosarcoma with bony mets.  He is currently receiving treatment at [facility].  No history of DVT or pulmonary embolism.  No unilateral leg swelling.  No cough or congestion.  Denies fevers.  Pain was sudden in onset and central.  It is improving now.  Initial EKG with questionable ST changes seen by EMS and code STEMI was called.  He presented to the ER as a code STEMI.   Past Medical History:  Diagnosis Date  . Allergy   . Mass of nasal sinus   . Wears dentures     Patient Active Problem List   Diagnosis Date Noted  . 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 EXCISION NASAL MASS Right 07/02/2017   Procedure: EXCISION NASAL MASS;  Surgeon: Izora Gala, MD;  Location: [facility];  Service: ENT;  Laterality: Right;  Right nasal endoscopy with biopsy of right nasal mass  . MULTIPLE TOOTH EXTRACTIONS    . TUMOR EXCISION     parathyroidectomy        Home Medications    Prior to Admission medications   Medication Sig Start Date End Date Taking? Authorizing Provider  predniSONE (DELTASONE) 20 MG tablet Take 40mg  x 5 days, then 20mg  x 5 days. 06/20/17   McVey, Gelene Mink, PA-C    Family History Family History  Problem Relation Age of Onset  . Diabetes Mother   . Heart attack Father     Social History Social History   Tobacco Use  . Smoking status: Current Every Day Smoker    Packs/day: 0.25    Types:  Cigarettes  . Smokeless tobacco: Never Used  . Tobacco comment: pt to get a PCP to discuss options  Substance Use Topics  . Alcohol use: Yes    Alcohol/week: 1.0 standard drinks    Types: 1 Standard drinks or equivalent per week    Comment: rare  . Drug use: Never     Allergies   Patient has no known allergies.   Review of Systems Review of Systems  All other systems reviewed and are negative.    Physical Exam Updated Vital Signs BP 91/62   Pulse 86   Temp 98 F (36.7 C) (Oral)   Resp 14   SpO2 100%   Physical Exam Vitals signs and nursing note reviewed.  Constitutional:      Appearance: He is well-developed.  HENT:     Head: Normocephalic and atraumatic.  Neck:     Musculoskeletal: Normal range of motion.  Cardiovascular:     Rate and Rhythm: Normal rate and regular rhythm.     Heart sounds: Normal heart sounds.  Pulmonary:     Effort: Pulmonary effort is normal. No respiratory distress.     Breath sounds: Normal breath sounds.  Abdominal:     General: There is no distension.     Palpations: Abdomen is soft.     Tenderness:  There is no abdominal tenderness.  Musculoskeletal: Normal range of motion.        General: No swelling or tenderness.     Right lower leg: No edema.     Left lower leg: No edema.  Skin:    General: Skin is warm and dry.  Neurological:     Mental Status: He is alert and oriented to person, place, and time.  Psychiatric:        Judgment: Judgment normal.      ED Treatments / Results  Labs (all labs ordered are listed, but only abnormal results are displayed) Labs Reviewed  CBC - Abnormal; Notable for the following components:      Result Value   RBC 3.13 (*)    Hemoglobin 10.3 (*)    HCT 30.5 (*)    RDW 17.8 (*)    All other components within normal limits  COMPREHENSIVE METABOLIC PANEL - Abnormal; Notable for the following components:   Glucose, Bld 190 (*)    BUN 22 (*)    Calcium 8.7 (*)    All other components  within normal limits  D-DIMER, QUANTITATIVE (NOT AT [facility]) - Abnormal; Notable for the following components:   D-Dimer, Quant 9.84 (*)    All other components within normal limits  TROPONIN I  TROPONIN I    EKG EKG Interpretation  Date/Time:  Friday June 19 2018 19:57:49 EDT Ventricular Rate:  79 PR Interval:    QRS Duration: 94 QT Interval:  386 QTC Calculation: 443 R Axis:   70 Text Interpretation:  Sinus rhythm Consider left ventricular hypertrophy ST elev, probable normal early repol pattern No significant change was found Confirmed by Jola Schmidt [phone]) on 06/19/2018 11:52:01 PM   Radiology Dg Chest 2 View  Result Date: 06/19/2018 CLINICAL DATA:  Chest pain. EXAM: CHEST - 2 VIEW COMPARISON:  Radiographs of Jul 22, 2006. Report of chest x-ray performed at [facility] on September 04, 2017. FINDINGS: The heart size and mediastinal contours are within normal limits. Both lungs are clear. No pneumothorax or pleural effusion is noted. Left internal jugular Port-A-Cath is noted with distal tip of catheter over expected position of duplicated SVC as described on prior radiograph at [facility]. The visualized skeletal structures are unremarkable. IMPRESSION: No active cardiopulmonary disease. Electronically Signed   By: Marijo Conception, M.D.   On: 06/19/2018 20:55   Ct Angio Chest Pe W And/or Wo Contrast  Result Date: 06/19/2018 CLINICAL DATA:  Chest pain, shortness of breath. EXAM: CT ANGIOGRAPHY CHEST WITH CONTRAST TECHNIQUE: Multidetector CT imaging of the chest was performed using the standard protocol during bolus administration of intravenous contrast. Multiplanar CT image reconstructions and MIPs were obtained to evaluate the vascular anatomy. CONTRAST:  32mL OMNIPAQUE IOHEXOL 350 MG/ML SOLN COMPARISON:  Radiographs of same day. FINDINGS: Cardiovascular: Satisfactory opacification of the pulmonary arteries to the segmental level. No evidence of pulmonary embolism.  Normal heart size. No pericardial effusion. Left internal jugular Port-A-Cath is noted which passes through left-sided duplicated SVC. Mediastinum/Nodes: No enlarged mediastinal, hilar, or axillary lymph nodes. Thyroid gland, trachea, and esophagus demonstrate no significant findings. Lungs/Pleura: No pneumothorax or pleural effusion is noted. Bleb is seen in left lung apex. No acute pulmonary disease is noted. Upper Abdomen: No acute abnormality. Musculoskeletal: No chest wall abnormality. No acute or significant osseous findings. Review of the MIP images confirms the above findings. IMPRESSION: No definite evidence of pulmonary embolus. No acute abnormality seen in the chest. Electronically  Signed   By: Marijo Conception, M.D.   On: 06/19/2018 21:48    Procedures .Critical Care Performed by: Jola Schmidt, MD Authorized by: Jola Schmidt, MD   Critical care provider statement:    Critical care time (minutes):  31   Critical care was time spent personally by me on the following activities:  Discussions with consultants, evaluation of patient's response to treatment, examination of patient, ordering and performing treatments and interventions, ordering and review of laboratory studies, ordering and review of radiographic studies, pulse oximetry, re-evaluation of patient's condition, obtaining history from patient or surrogate and review of old charts   (including critical care time)  Medications Ordered in ED Medications  iohexol (OMNIPAQUE) 350 MG/ML injection 75 mL (75 mLs Intravenous Contrast Given 06/19/18 2122)     Initial Impression / Assessment and Plan / ED Course  I have reviewed the triage vital signs and the nursing notes.  Pertinent labs & imaging results that were available during my care of the patient were reviewed by me and considered in my medical decision making (see chart for details).        Code STEMI on arrival.  I personally reviewed the patient's EKG strips from EMS.   There is no clear ST elevation on these.  There was poor baseline.  Possible early repolarization and PVC noted.  Patient's chest pain on arrival to the emergency department is improving.  Initial EKG demonstrates no ST elevation.  This was reviewed with interventional cardiologist Dr. Scarlette Calico who agrees that no ST elevation is noted.  A code STEMI canceled.  There is some sharp and pleuritic nature to this pain.  Elevated d-dimer.  CT angios negative for pulmonary embolism.  Patient without active chest pain ongoing while in the emergency department.  Initial troponin is negative.  Second troponin is still pending at this time.  Patient can safely be discharged home when his second troponin returns normal.  Atypical chest pain.  Doubt ACS.  Doubt PE.  Doubt dissection.  May represent pleurisy versus pericarditis.  Overall well-appearing.  Plan for outpatient primary care follow-up.  Final Clinical Impressions(s) / ED Diagnoses   Final diagnoses:  None    ED Discharge Orders    None       Jola Schmidt, MD 06/19/18 2353

## 2018-06-20 LAB — TROPONIN I: Troponin I: <0.03 ng/mL (ref <0.03)

## 2018-07-15 ENCOUNTER — Encounter: Payer: Self-pay | Admitting: Physician Assistant

## 2018-07-15 ENCOUNTER — Ambulatory Visit (INDEPENDENT_AMBULATORY_CARE_PROVIDER_SITE_OTHER): Payer: 59 | Admitting: Physician Assistant

## 2018-07-15 VITALS — BP 104/66 | HR 85 | Temp 98.0°F | Ht 72.0 in | Wt 137.0 lb

## 2018-07-15 DIAGNOSIS — K219 Gastro-esophageal reflux disease without esophagitis: Secondary | ICD-10-CM | POA: Insufficient documentation

## 2018-07-15 DIAGNOSIS — Z13220 Encounter for screening for lipoid disorders: Secondary | ICD-10-CM

## 2018-07-15 DIAGNOSIS — E440 Moderate protein-calorie malnutrition: Secondary | ICD-10-CM | POA: Insufficient documentation

## 2018-07-15 DIAGNOSIS — Z131 Encounter for screening for diabetes mellitus: Secondary | ICD-10-CM

## 2018-07-15 DIAGNOSIS — Z23 Encounter for immunization: Secondary | ICD-10-CM

## 2018-07-15 DIAGNOSIS — C499 Malignant neoplasm of connective and soft tissue, unspecified: Secondary | ICD-10-CM | POA: Diagnosis not present

## 2018-07-15 DIAGNOSIS — R0789 Other chest pain: Secondary | ICD-10-CM | POA: Insufficient documentation

## 2018-07-15 DIAGNOSIS — M25511 Pain in right shoulder: Secondary | ICD-10-CM | POA: Insufficient documentation

## 2018-07-15 DIAGNOSIS — C7951 Secondary malignant neoplasm of bone: Secondary | ICD-10-CM | POA: Insufficient documentation

## 2018-07-15 NOTE — Patient Instructions (Addendum)
Get labs will make cardiologist appt.  Start exercises. Follow up in 4 weeks to see if any improvement.

## 2018-07-15 NOTE — Progress Notes (Signed)
New Patient Office Visit  Subjective:  Patient ID: Bruce Lynch, male    DOB: May 15, 1970  Age: 48 y.o. MRN: [id number]  CC:  Chief Complaint  Patient presents with  . Establish Care    HPI Bruce Lynch presents to establish care. 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 He recently had episode of chest pain that he went to the ED for on 06/19/2018. EKG, negative troponins, negative CTA was given nitro and ASA. CP resolved and discharged. He has not had CP since. Oncologist would like for him to see cardiology. He is off chemotherapy for one week.   Pt continues to smoke 1 pack about every 3 days.  Pt is having right shoulder pain for the last 3 weeks. No known injury or overuse. Worse with externally rotating and overhead motions. On pain medication.   Past Medical History:  Diagnosis Date  . Allergy   . Bone metastasis ([place])   . Cancer ([place])   . Heart attack ([place])   . Mass of nasal sinus   . Rhabdomyosarcoma ([place])   . Wears dentures     Past Surgical History:  Procedure Laterality Date  . EXCISION NASAL MASS Right 07/02/2017   Procedure: EXCISION NASAL MASS;  Surgeon: Izora Gala, MD;  Location: [place];  Service: ENT;  Laterality: Right;  Right nasal endoscopy with biopsy of right nasal mass  . MULTIPLE TOOTH EXTRACTIONS    . TUMOR EXCISION     parathyroidectomy    Family History  Problem Relation Age of Onset  . Diabetes Mother   . Heart attack Mother   . Hypertension Mother   . Heart attack Father     Social History   Socioeconomic History  . Marital status: Married    Spouse name: Not on file  . Number of children: Not on file  . Years of education: Not on file  . Highest education level: Not on file  Occupational History  . Not on file  Social Needs  . Financial resource strain: Not on file  . Food insecurity:    Worry: Not on file    Inability: Not on file  . Transportation  needs:    Medical: Not on file    Non-medical: Not on file  Tobacco Use  . Smoking status: Current Every Day Smoker    Packs/day: 0.25    Years: 35.00    Pack years: 8.75    Types: Cigarettes  . Smokeless tobacco: Never Used  . Tobacco comment: pt to get a PCP to discuss options  Substance and Sexual Activity  . Alcohol use: Yes    Alcohol/week: 1.0 standard drinks    Types: 1 Standard drinks or equivalent per week    Comment: rare  . Drug use: Never  . Sexual activity: Not Currently    Partners: Female  Lifestyle  . Physical activity:    Days per week: Not on file    Minutes per session: Not on file  . Stress: Not on file  Relationships  . Social connections:    Talks on phone: Not on file    Gets together: Not on file    Attends religious service: Not on file    Active member of club or organization: Not on file    Attends meetings of clubs or organizations: Not on file    Relationship status: Not on file  . Intimate partner violence:  Fear of current or ex partner: Not on file    Emotionally abused: Not on file    Physically abused: Not on file    Forced sexual activity: Not on file  Other Topics Concern  . Not on file  Social History Narrative  . Not on file    ROS Review of Systems  All other systems reviewed and are negative.   Objective:   Today's Vitals: BP 104/66   Pulse 85   Temp 98 F (36.7 C) (Oral)   Ht 6' (1.829 m)   Wt 137 lb (62.1 kg)   SpO2 100%   BMI 18.58 kg/m   Physical Exam Vitals signs reviewed.  Constitutional:      Appearance: Normal appearance.  HENT:     Head: Normocephalic and atraumatic.  Cardiovascular:     Rate and Rhythm: Normal rate and regular rhythm.  Pulmonary:     Effort: Pulmonary effort is normal.     Breath sounds: Normal breath sounds.  Neurological:     General: No focal deficit present.     Mental Status: He is alert and oriented to person, place, and time.  Psychiatric:        Mood and Affect: Mood  normal.        Behavior: Behavior normal.     Assessment & Plan:   Problem List Items Addressed This Visit      Unprioritized   Rhabdomyosarcoma ([place]) - Primary   Relevant Medications   dexamethasone (DECADRON) 4 MG tablet   Gastroesophageal reflux disease   Relevant Medications   bisacodyl (DULCOLAX) 5 MG EC tablet   pantoprazole (PROTONIX) 40 MG tablet   Bone metastases (HCC)   Relevant Medications   dexamethasone (DECADRON) 4 MG tablet   Malnutrition of moderate degree (HCC)   Atypical chest pain   Acute pain of right shoulder    Other Visit Diagnoses    Screening for diabetes mellitus       Relevant Orders   COMPLETE METABOLIC PANEL WITH GFR   Hemoglobin A1c   Screening for lipid disorders       Relevant Orders   Lipid Panel w/reflex Direct LDL   Need for tetanus booster       Relevant Orders   Tdap vaccine greater than or equal to 7yo IM (Completed)      Outpatient Encounter Medications as of 07/15/2018  Medication Sig  . bisacodyl (DULCOLAX) 5 MG EC tablet Take by mouth.  . carboxymethylcellulose (REFRESH TEARS) 0.5 % SOLN Place 1-2 drops into both eyes 3 (three) times a day  . dexamethasone (DECADRON) 4 MG tablet TAKE 8MG  (2 X 4MG  TABLETS) BY MOUTH IN THE MORNING FOR 2 DAYS AFTER CHEMOTHERAPY, THEN AS DIRECTED.  Marland Kitchen gabapentin (NEURONTIN) 300 MG capsule Take 1 capsule by mouth 3 (three) times daily.  Marland Kitchen latanoprost (XALATAN) 0.005 % ophthalmic solution Apply to eye.  . lidocaine (LIDODERM) 5 % PLEASE SEE ATTACHED FOR DETAILED DIRECTIONS  . lidocaine (XYLOCAINE) 2 % solution Swish and swallow 5 mLs 4 (four) times daily as needed for Pain Can dilute in water; use before meals for sore mouth/throat  . morphine (MS CONTIN) 15 MG 12 hr tablet Take 2 tablets by mouth 2 (two) times a day.  . oxyCODONE (OXY IR/ROXICODONE) 5 MG immediate release tablet Take 1 tablet by mouth 2 (two) times daily as needed.  . pantoprazole (PROTONIX) 40 MG tablet Take 1 tablet by mouth daily.   . [DISCONTINUED] fluticasone (FLONASE) 50 MCG/ACT  nasal spray SPRAY 2 SPRAYS INTO EACH NOSTRIL EVERY DAY  . [DISCONTINUED] prochlorperazine (COMPAZINE) 10 MG tablet Take by mouth.  . [DISCONTINUED] predniSONE (DELTASONE) 20 MG tablet Take 40mg  x 5 days, then 20mg  x 5 days.   No facility-administered encounter medications on file as of 07/15/2018.    Tdap given today.   Will make referral for cardiology. I suspect CP is not cardiac could be pleuritic or even GERD related. CP has not returned. I do not see that lipids have ever been checked ordered today.   Right shoulder pain seems like impingement syndrome vs bursitis. Viewed PET scan and mets were in the left humerus not right. Injection done today. Given stretches. Discussed conservative care. Follow up in 4 weeks. May consider PT. NSAIDs could help but will hold off for CV evaluation.   Shoulder Injection Procedure Note  Pre-operative Diagnosis: right shoulder pain.  Post-operative Diagnosis: same  Indications: pain  Procedure Details   Verbal consent was obtained for the procedure. The shoulder was prepped with iodine and the skin was anesthetized. Using a 22 gauge needle the glenohumeral joint is injected with 9 mL 1% lidocaine and 1 mL of depo medrol 40mg  under the posterior aspect of the acromion. The injection site was cleansed with topical isopropyl alcohol and a dressing was applied.  Complications:  None; patient tolerated the procedure well.  Follow-up: Return in about 4 weeks (around 08/12/2018) for follow up shoulder.   Iran Planas, PA-C

## 2018-11-07 ENCOUNTER — Other Ambulatory Visit: Payer: Self-pay

## 2018-11-07 ENCOUNTER — Emergency Department (HOSPITAL_COMMUNITY): Payer: 59

## 2018-11-07 ENCOUNTER — Encounter (HOSPITAL_COMMUNITY): Payer: Self-pay | Admitting: Emergency Medicine

## 2018-11-07 ENCOUNTER — Inpatient Hospital Stay (HOSPITAL_COMMUNITY)
Admission: EM | Admit: 2018-11-07 | Discharge: 2018-11-11 | DRG: 947 | Disposition: A | Discharge status: Skilled Nursing Facility | Payer: 59 | Attending: Internal Medicine | Admitting: Internal Medicine

## 2018-11-07 DIAGNOSIS — J90 Pleural effusion, not elsewhere classified: Secondary | ICD-10-CM | POA: Diagnosis present

## 2018-11-07 DIAGNOSIS — Z8249 Family history of ischemic heart disease and other diseases of the circulatory system: Secondary | ICD-10-CM

## 2018-11-07 DIAGNOSIS — Z515 Encounter for palliative care: Secondary | ICD-10-CM | POA: Diagnosis present

## 2018-11-07 DIAGNOSIS — Z681 Body mass index (BMI) 19 or less, adult: Secondary | ICD-10-CM

## 2018-11-07 DIAGNOSIS — F17210 Nicotine dependence, cigarettes, uncomplicated: Secondary | ICD-10-CM | POA: Diagnosis present

## 2018-11-07 DIAGNOSIS — T451X5A Adverse effect of antineoplastic and immunosuppressive drugs, initial encounter: Secondary | ICD-10-CM | POA: Diagnosis present

## 2018-11-07 DIAGNOSIS — G893 Neoplasm related pain (acute) (chronic): Principal | ICD-10-CM | POA: Diagnosis present

## 2018-11-07 DIAGNOSIS — N179 Acute kidney failure, unspecified: Secondary | ICD-10-CM | POA: Diagnosis present

## 2018-11-07 DIAGNOSIS — Z66 Do not resuscitate: Secondary | ICD-10-CM | POA: Diagnosis present

## 2018-11-07 DIAGNOSIS — C7951 Secondary malignant neoplasm of bone: Secondary | ICD-10-CM | POA: Diagnosis present

## 2018-11-07 DIAGNOSIS — Z9221 Personal history of antineoplastic chemotherapy: Secondary | ICD-10-CM

## 2018-11-07 DIAGNOSIS — I10 Essential (primary) hypertension: Secondary | ICD-10-CM | POA: Diagnosis present

## 2018-11-07 DIAGNOSIS — E892 Postprocedural hypoparathyroidism: Secondary | ICD-10-CM | POA: Diagnosis present

## 2018-11-07 DIAGNOSIS — Z833 Family history of diabetes mellitus: Secondary | ICD-10-CM

## 2018-11-07 DIAGNOSIS — K625 Hemorrhage of anus and rectum: Secondary | ICD-10-CM | POA: Diagnosis not present

## 2018-11-07 DIAGNOSIS — D61818 Other pancytopenia: Secondary | ICD-10-CM | POA: Diagnosis not present

## 2018-11-07 DIAGNOSIS — C490 Malignant neoplasm of connective and soft tissue of head, face and neck: Secondary | ICD-10-CM | POA: Diagnosis present

## 2018-11-07 DIAGNOSIS — N132 Hydronephrosis with renal and ureteral calculous obstruction: Secondary | ICD-10-CM | POA: Diagnosis present

## 2018-11-07 DIAGNOSIS — I252 Old myocardial infarction: Secondary | ICD-10-CM

## 2018-11-07 DIAGNOSIS — D61811 Other drug-induced pancytopenia: Secondary | ICD-10-CM | POA: Diagnosis present

## 2018-11-07 DIAGNOSIS — R52 Pain, unspecified: Secondary | ICD-10-CM | POA: Diagnosis present

## 2018-11-07 DIAGNOSIS — K920 Hematemesis: Secondary | ICD-10-CM | POA: Diagnosis present

## 2018-11-07 DIAGNOSIS — K219 Gastro-esophageal reflux disease without esophagitis: Secondary | ICD-10-CM | POA: Diagnosis present

## 2018-11-07 DIAGNOSIS — Z79899 Other long term (current) drug therapy: Secondary | ICD-10-CM

## 2018-11-07 DIAGNOSIS — R64 Cachexia: Secondary | ICD-10-CM | POA: Diagnosis present

## 2018-11-07 DIAGNOSIS — Z7189 Other specified counseling: Secondary | ICD-10-CM

## 2018-11-07 DIAGNOSIS — Z20828 Contact with and (suspected) exposure to other viral communicable diseases: Secondary | ICD-10-CM | POA: Diagnosis present

## 2018-11-07 LAB — URINALYSIS, ROUTINE W REFLEX MICROSCOPIC

## 2018-11-07 LAB — COMPREHENSIVE METABOLIC PANEL
ALT: 11 U/L (ref 0–44)
AST: 44 U/L — ABNORMAL HIGH (ref 15–41)
Albumin: 3.8 g/dL (ref 3.5–5.0)
Alkaline Phosphatase: 63 U/L (ref 38–126)
Anion gap: 23 — ABNORMAL HIGH (ref 5–15)
BUN: 183 mg/dL — ABNORMAL HIGH (ref 6–20)
CO2: 19 mmol/L — ABNORMAL LOW (ref 22–32)
Calcium: 9.5 mg/dL (ref 8.9–10.3)
Chloride: 88 mmol/L — ABNORMAL LOW (ref 98–111)
Creatinine, Ser: 6.75 mg/dL — ABNORMAL HIGH (ref 0.61–1.24)
GFR calc Af Amer: 10 mL/min — ABNORMAL LOW (ref >60)
GFR calc non Af Amer: 9 mL/min — ABNORMAL LOW (ref >60)
Glucose, Bld: 149 mg/dL — ABNORMAL HIGH (ref 70–99)
Potassium: 5.3 mmol/L — ABNORMAL HIGH (ref 3.5–5.1)
Sodium: 130 mmol/L — ABNORMAL LOW (ref 135–145)
Total Bilirubin: 0.4 mg/dL (ref 0.3–1.2)
Total Protein: 7.7 g/dL (ref 6.5–8.1)

## 2018-11-07 LAB — URINALYSIS, MICROSCOPIC (REFLEX): RBC / HPF: >50 RBC/hpf (ref 0–5)

## 2018-11-07 LAB — PREPARE RBC (CROSSMATCH)

## 2018-11-07 LAB — ABO/RH: ABO/RH(D): A POS

## 2018-11-07 LAB — SARS CORONAVIRUS 2 BY RT PCR (HOSPITAL ORDER, PERFORMED IN ~~LOC~~ HOSPITAL LAB): SARS Coronavirus 2: NEGATIVE

## 2018-11-07 LAB — LIPASE, BLOOD: Lipase: 35 U/L (ref 11–51)

## 2018-11-07 MED ORDER — SENNOSIDES-DOCUSATE SODIUM 8.6-50 MG PO TABS
1.0000 | ORAL_TABLET | Freq: Two times a day (BID) | ORAL | Status: DC
  Administered 2018-11-07 – 2018-11-10 (×5): 1 via ORAL
  Filled 2018-11-07 (×5): qty 1

## 2018-11-07 MED ORDER — ORAL CARE MOUTH RINSE
15.0000 mL | Freq: Two times a day (BID) | OROMUCOSAL | Status: DC
  Administered 2018-11-07 – 2018-11-08 (×2): 15 mL via OROMUCOSAL

## 2018-11-07 MED ORDER — SODIUM CHLORIDE 0.9% IV SOLUTION
Freq: Once | INTRAVENOUS | Status: AC
  Administered 2018-11-07: 14:00:00 via INTRAVENOUS

## 2018-11-07 MED ORDER — ENSURE ENLIVE PO LIQD: 237.0000 mL | Freq: Two times a day (BID) | ORAL | Status: DC

## 2018-11-07 MED ORDER — SODIUM CHLORIDE 0.9 % IV BOLUS
1000.0000 mL | Freq: Once | INTRAVENOUS | Status: AC
  Administered 2018-11-07: 1000 mL via INTRAVENOUS

## 2018-11-07 MED ORDER — OXYCODONE HCL 5 MG PO TABS
5.0000 mg | ORAL_TABLET | ORAL | Status: DC | PRN
  Administered 2018-11-08: 10 mg via ORAL
  Filled 2018-11-07: qty 2

## 2018-11-07 MED ORDER — LIDOCAINE HCL URETHRAL/MUCOSAL 2 % EX GEL: 1.0000 "application " | Freq: Once | CUTANEOUS | Status: DC

## 2018-11-07 MED ORDER — HYDROMORPHONE HCL 1 MG/ML IJ SOLN
1.0000 mg | INTRAMUSCULAR | Status: DC | PRN
  Administered 2018-11-07 – 2018-11-08 (×3): 1 mg via INTRAVENOUS
  Filled 2018-11-07 (×3): qty 1

## 2018-11-07 MED ORDER — SODIUM CHLORIDE 0.9% FLUSH
10.0000 mL | Freq: Two times a day (BID) | INTRAVENOUS | Status: DC
  Administered 2018-11-07 – 2018-11-10 (×5): 10 mL

## 2018-11-07 MED ORDER — ONDANSETRON HCL 4 MG/2ML IJ SOLN: 4.0000 mg | Freq: Four times a day (QID) | INTRAMUSCULAR | Status: DC | PRN

## 2018-11-07 MED ORDER — MORPHINE SULFATE ER 30 MG PO TBCR: 60.0000 mg | EXTENDED_RELEASE_TABLET | Freq: Two times a day (BID) | ORAL | Status: DC | PRN

## 2018-11-07 MED ORDER — GABAPENTIN 300 MG PO CAPS
300.0000 mg | ORAL_CAPSULE | Freq: Every day | ORAL | Status: DC | PRN
  Administered 2018-11-08: 13:00:00 300 mg via ORAL
  Filled 2018-11-07: qty 1

## 2018-11-07 MED ORDER — SODIUM CHLORIDE 0.9 % IV SOLN
INTRAVENOUS | Status: DC
  Administered 2018-11-07: 20:00:00 via INTRAVENOUS

## 2018-11-07 MED ORDER — SODIUM CHLORIDE 0.9% FLUSH: 10.0000 mL | INTRAVENOUS | Status: DC | PRN

## 2018-11-07 MED ORDER — MORPHINE SULFATE (PF) 4 MG/ML IV SOLN
4.0000 mg | Freq: Once | INTRAVENOUS | Status: AC
  Administered 2018-11-07: 4 mg via INTRAVENOUS
  Filled 2018-11-07: qty 1

## 2018-11-07 MED ORDER — ONDANSETRON HCL 4 MG/2ML IJ SOLN
4.0000 mg | Freq: Once | INTRAMUSCULAR | Status: AC
  Administered 2018-11-07: 4 mg via INTRAVENOUS
  Filled 2018-11-07: qty 2

## 2018-11-07 NOTE — ED Notes (Signed)
Per Dr. Tyrone Nine pt will accept blood transfusion

## 2018-11-07 NOTE — Progress Notes (Signed)
Pt arrived to unit with wife at bedside. C/O severe pain, PAC accessed to give prn pain medication. PRBC infusing upon arrival to PIV.

## 2018-11-07 NOTE — ED Provider Notes (Signed)
[facility] DEPT Provider Note   CSN: [id number] Arrival date & time: 11/07/18  1001     History   Chief Complaint No chief complaint on file.   HPI Jaymir Slaski is a 48 y.o. male.     48 yo M with a chief complaints of bright red blood per rectum.  This is also been somewhat dark.  Started last night and then had multiple events this morning.  He is unsure how many.  Having some pain to the left side.  He has a mass to his lower abdomen that is been there for some months.  He denies fever denies vomiting.  The history is provided by the patient.  Illness Severity:  Moderate Onset quality:  Sudden Duration:  14 hours Timing:  Constant Progression:  Worsening Chronicity:  New Associated symptoms: abdominal pain and diarrhea   Associated symptoms: no chest pain, no congestion, no fever, no headaches, no myalgias, no rash, no shortness of breath and no vomiting     Past Medical History:  Diagnosis Date   Allergy    Bone metastasis ([place])    Cancer ([place])    Heart attack ([place])    Mass of nasal sinus    Rhabdomyosarcoma ([place])    Wears dentures     Patient Active Problem List   Diagnosis Date Noted   Rhabdomyosarcoma ([place]) 07/15/2018   Gastroesophageal reflux disease 07/15/2018   Bone metastases ([place]) 07/15/2018   Malnutrition of moderate degree ([place]) 07/15/2018   Atypical chest pain 07/15/2018   Acute pain of right shoulder 07/15/2018   Nasal polyposis 06/20/2017    Past Surgical History:  Procedure Laterality Date   EXCISION NASAL MASS Right 07/02/2017   Procedure: EXCISION NASAL MASS;  Surgeon: Izora Gala, MD;  Location: [facility];  Service: ENT;  Laterality: Right;  Right nasal endoscopy with biopsy of right nasal mass   MULTIPLE TOOTH EXTRACTIONS     TUMOR EXCISION     parathyroidectomy        Home Medications    Prior to Admission medications   Medication Sig Start Date End Date Taking? Authorizing  Provider  gabapentin (NEURONTIN) 300 MG capsule Take 300 mg by mouth daily as needed (nerve pain/tingling).  09/18/17 11/07/18 Yes [provider]  lidocaine (LIDODERM) 5 % Place 1 patch onto the skin daily as needed (pain).  05/17/18  Yes [provider]  morphine (MS CONTIN) 60 MG 12 hr tablet Take 60 mg by mouth every 12 (twelve) hours as needed for pain.  06/11/18  Yes [provider]  ondansetron (ZOFRAN-ODT) 4 MG disintegrating tablet Take 4 mg by mouth every 8 (eight) hours as needed for nausea or vomiting.   Yes [provider]  oxyCODONE (OXY IR/ROXICODONE) 5 MG immediate release tablet Take 5-10 mg by mouth every 4 (four) hours as needed for pain. 10/09/18  Yes [provider]    Family History Family History  Problem Relation Age of Onset   Diabetes Mother    Heart attack Mother    Hypertension Mother    Heart attack Father     Social History Social History   Tobacco Use   Smoking status: Current Every Day Smoker    Packs/day: 0.25    Years: 35.00    Pack years: 8.75    Types: Cigarettes   Smokeless tobacco: Never Used   Tobacco comment: pt to get a PCP to discuss options  Substance Use Topics   Alcohol use:  Yes    Alcohol/week: 1.0 standard drinks    Types: 1 Standard drinks or equivalent per week    Comment: rare   Drug use: Never     Allergies   Patient has no known allergies.   Review of Systems Review of Systems  Constitutional: Negative for chills and fever.  HENT: Negative for congestion and facial swelling.   Eyes: Negative for discharge and visual disturbance.  Respiratory: Negative for shortness of breath.   Cardiovascular: Negative for chest pain and palpitations.  Gastrointestinal: Positive for abdominal pain, blood in stool and diarrhea. Negative for vomiting.  Musculoskeletal: Negative for arthralgias and myalgias.  Skin: Negative for color change and rash.  Neurological: Negative for tremors,  syncope and headaches.  Psychiatric/Behavioral: Negative for confusion and dysphoric mood.     Physical Exam Updated Vital Signs BP (!) 134/93    Pulse (!) 106    Temp (!) 97.5 F (36.4 C) (Oral)    Resp 16    Ht 6' (1.829 m)    Wt 62.6 kg    SpO2 100%    BMI 18.72 kg/m   Physical Exam Vitals signs and nursing note reviewed.  Constitutional:      Appearance: He is cachectic.     Comments: Chronically ill-appearing  HENT:     Head: Normocephalic and atraumatic.  Eyes:     Pupils: Pupils are equal, round, and reactive to light.  Neck:     Musculoskeletal: Normal range of motion and neck supple.     Vascular: No JVD.  Cardiovascular:     Rate and Rhythm: Normal rate and regular rhythm.     Heart sounds: No murmur. No friction rub. No gallop.   Pulmonary:     Effort: No respiratory distress.     Breath sounds: No wheezing.  Abdominal:     General: There is no distension.     Palpations: There is mass.     Tenderness: There is no guarding or rebound.     Comments: Suprapubic mass.  Slightly compressible.  Nonreducible.  Nontender.  No erythema.  No significant pain to the left lower or left upper quadrant.  Musculoskeletal: Normal range of motion.  Skin:    Coloration: Skin is not pale.     Findings: No rash.  Neurological:     Mental Status: He is alert and oriented to person, place, and time.  Psychiatric:        Behavior: Behavior normal.      ED Treatments / Results  Labs (all labs ordered are listed, but only abnormal results are displayed) Labs Reviewed  CBC WITH DIFFERENTIAL/PLATELET - Abnormal; Notable for the following components:      Result Value   WBC 3.3 (*)    RBC 1.94 (*)    Hemoglobin 6.4 (*)    HCT 19.4 (*)    RDW 18.6 (*)    Platelets 10 (*)    nRBC 9.2 (*)    Lymphs Abs 0.4 (*)    Abs Immature Granulocytes 0.12 (*)    All other components within normal limits  COMPREHENSIVE METABOLIC PANEL - Abnormal; Notable for the following components:    Sodium 130 (*)    Potassium 5.3 (*)    Chloride 88 (*)    CO2 19 (*)    Glucose, Bld 149 (*)    BUN 183 (*)    Creatinine, Ser 6.75 (*)    AST 44 (*)    GFR calc non Af Wyvonnia Lora  9 (*)    GFR calc Af Amer 10 (*)    Anion gap 23 (*)    All other components within normal limits  SARS CORONAVIRUS 2 (HOSPITAL ORDER, [employer] [facility])  LIPASE, BLOOD  URINALYSIS, ROUTINE W REFLEX MICROSCOPIC  DIC (DISSEMINATED INTRAVASCULAR COAGULATION) PANEL  TYPE AND SCREEN  PREPARE PLATELET PHERESIS  PREPARE RBC (CROSSMATCH)  ABO/RH    EKG None  Radiology Ct Abdomen Pelvis Wo Contrast  Result Date: 11/07/2018 CLINICAL DATA:  Rectal bleeding. 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 EXAM: CT ABDOMEN AND PELVIS WITHOUT CONTRAST TECHNIQUE: Multidetector CT imaging of the abdomen and pelvis was performed following the standard protocol without IV contrast. COMPARISON:  None. FINDINGS: Lower chest: Partially visualized small right anterior basal pneumothorax. Partially visualized moderate right pleural effusion with partial collapse of the right lower lobe. Asymmetric soft tissue density involving the lower right anterior chest wall adjacent to the anterior fourth rib, with cortical irregularity of the rib and involvement of the anterior pleural surface (series 2, image 2). Hepatobiliary: No focal liver abnormality. No gallbladder wall thickening, distention, or surrounding inflammatory changes. Pancreas: Unremarkable. No pancreatic ductal dilatation or surrounding inflammatory changes. Spleen: Atrophic.  No focal abnormality. Adrenals/Urinary Tract: Adrenal glands are unremarkable. Severe right hydronephrosis to the level of the UPJ with minimal residual right renal parenchyma. Moderate left hydroureteronephrosis without obstructing calculi. 4 mm nonobstructive calculus in the midpole of the left kidney. Markedly distended bladder with a few diverticula. Stomach/Bowel: Stomach is within  normal limits. Appendix is not identified. No evidence of bowel wall thickening, distention, or inflammatory changes. Moderately increased colonic stool burden. Vascular/Lymphatic: Aortic atherosclerosis. No enlarged abdominal or pelvic lymph nodes. Reproductive: Prostate is normal in size.  Possible TURP defect. Other: No abdominal wall hernia or abnormality. No abdominopelvic ascites. No pneumoperitoneum. Musculoskeletal: Patchy sclerosis throughout the visualized thoracolumbar spine which may be related to chemotherapy. 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 IMPRESSION: 1. Partially visualized small right anterior basal pneumothorax and moderate right pleural effusion. 2. Asymmetric soft tissue density involving the lower right anterior chest wall and anterior fourth rib, with involvement of the anterior pleural surface, concerning for metastatic disease. 3. 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 4. Moderate left hydroureteronephrosis without definite obstructing lesion. Given markedly distended bladder, findings are likely related to urinary retention. 5. Nonobstructive left nephrolithiasis. 6. Moderately increased colonic stool burden.  No obstruction. 7.  Aortic atherosclerosis (ICD10-I70.0). Electronically Signed   By: Titus Dubin M.D.   On: 11/07/2018 13:26   Dg Chest Port 1 View  Result Date: 11/07/2018 CLINICAL DATA:  Rectal bleeding. EXAM: PORTABLE CHEST 1 VIEW COMPARISON:  June 19, 2018 FINDINGS: There is a stable left-sided Port-A-Cath terminating in the duplicated SVC. There is a probable skin fold over the lateral upper chest no convincing evidence of pneumothorax. There is haziness over the right lower lung. There appears to be some pleural thickening laterally in the right upper chest. The cardiomediastinal silhouette is stable. The left lung is clear. IMPRESSION: 1. Haziness over the  right chest is new and could represent loculated fluid, an underlying pulmonary infiltrate, or something on the patient or in the chest wall. There is also some pleural thickening laterally in the right upper chest. A PA and lateral chest x-ray may better evaluate. Alternatively, a CT scan could further evaluate. 2. There is a skin fold over the upper right lateral chest  but no convincing evidence of pneumothorax. 3. No other acute abnormalities. Electronically Signed   By: Dorise Bullion III M.D   On: 11/07/2018 14:02    Procedures Procedures (including critical care time)  Medications Ordered in ED Medications  lidocaine (XYLOCAINE) 2 % jelly 1 application (has no administration in time range)  sodium chloride 0.9 % bolus 1,000 mL (0 mLs Intravenous Stopped 11/07/18 1221)  0.9 %  sodium chloride infusion (Manually program via Guardrails IV Fluids) ( Intravenous New Bag/Given 11/07/18 1341)  morphine 4 MG/ML injection 4 mg (4 mg Intravenous Given 11/07/18 1325)  ondansetron (ZOFRAN) injection 4 mg (4 mg Intravenous Given 11/07/18 1325)     Initial Impression / Assessment and Plan / ED Course  I have reviewed the triage vital signs and the nursing notes.  Pertinent labs & imaging results that were available during my care of the patient were reviewed by me and considered in my medical decision making (see chart for details).        48 yo M with a chief complaints of bloody diarrhea and left lower quadrant abdominal pain.  Going on since last night.  Will obtain lab work CT scan.   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  This seems like it already has metastasis to bone.  He currently was placed on hospice care.  Is a DNR.  Last had chemotherapy at the beginning of June.  Patient to have significant uremia.  Metabolic acidosis with anion gap likely secondary to the same.  He has a mildly elevated potassium.  His hemoglobin  is about a gram lower than it was last checked at [facility] about a month ago.  Patient was initially reluctant to have a blood transfusion.  His platelets were also tanned.  We will give a sixpack of platelets as well as 2 units of blood.  CT scan changed from with without contrast due to renal insufficiency.  CT scan shows an enlarged bladder and then possibly a small right basilar pneumothorax.  Patient has had some shortness of breath off and on for at least a couple weeks.  No rapid worsening or change per the family.  Not requiring oxygen here.  Plain film ordered without obvious pneumothorax.    Foley placed with a large amounts of urine output and resolution of his abdominal mass.  I discussed case with Dr. Alessandra Bevels, GI will come and see patient.  He recommended that I add on coags.  I will send off for a DIC panel. Will discuss with medicine.   CRITICAL CARE Performed by: Cecilio Asper   Total critical care time: 80 minutes  Critical care time was exclusive of separately billable procedures and treating other patients.  Critical care was necessary to treat or prevent imminent or life-threatening deterioration.  Critical care was time spent personally by me on the following activities: development of treatment plan with patient and/or surrogate as well as nursing, discussions with consultants, evaluation of patient's response to treatment, examination of patient, obtaining history from patient or surrogate, ordering and performing treatments and interventions, ordering and review of laboratory studies, ordering and review of radiographic studies, pulse oximetry and re-evaluation of patient's condition.  The patients results and plan were reviewed and discussed.   Any x-rays performed were independently reviewed by myself.   Differential diagnosis were considered with the presenting HPI.  Medications  lidocaine (XYLOCAINE) 2 % jelly 1 application (has no administration in time  range)  sodium chloride 0.9 % bolus 1,000 mL (0 mLs Intravenous Stopped 11/07/18 1221)  0.9 %  sodium chloride infusion (Manually program via Guardrails IV Fluids) ( Intravenous New Bag/Given 11/07/18 1341)  morphine 4 MG/ML injection 4 mg (4 mg Intravenous Given 11/07/18 1325)  ondansetron (ZOFRAN) injection 4 mg (4 mg Intravenous Given 11/07/18 1325)    Vitals:   11/07/18 1013 11/07/18 1234 11/07/18 1336 11/07/18 1352  BP:  130/90 (!) 143/86 (!) 134/93  Pulse:  (!) 106 (!) 102 (!) 106  Resp:  18 16 16   Temp:   98.7 F (37.1 C) (!) 97.5 F (36.4 C)  TempSrc:   Oral Oral  SpO2:  98% 99% 100%  Weight: 62.6 kg     Height: 6' (1.829 m)       Final diagnoses:  Pancytopenia (HCC)  Rectal bleeding    Admission/ observation were discussed with the admitting physician, patient and/or family and they are comfortable with the plan.    Final Clinical Impressions(s) / ED Diagnoses   Final diagnoses:  Pancytopenia [facility])  Rectal bleeding    ED Discharge Orders    None       Deno Etienne, DO 11/07/18 1420

## 2018-11-07 NOTE — Consult Note (Signed)
Referring Provider:  EDP Primary Care Physician:  Lavada Mesi Primary Gastroenterologist: Althia Forts  Reason for Consultation: Rectal bleeding, pancytopenia, platelet count of 10.  HPI: Bruce Lynch is a 48 y.o. 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  Upon further evaluation, he was found to have hemoglobin of 6.4 with platelet count of only 10.  Creatinine of 6.75 with normal creatinine earlier in April 2020.  Normal lipase.  Normal LFTs except for mild elevated AST at 44.  GI is consulted for further evaluation.   Patient seen and examined at bedside in the emergency room.  Most of the history obtained from patient's wife.  According to patient's wife, patient is not able to get any chemotherapy done since May 2020 because of low platelet count.  Around 1 week ago he started having intermittent nausea and vomiting with streaks of blood in the vomiting.  He started having rectal bleeding yesterday.  Complaining of generalized abdominal pain.  Past Medical History:  Diagnosis Date  . Allergy   . Bone metastasis ([place])   . Cancer ([place])   . Heart attack ([place])   . Mass of nasal sinus   . Rhabdomyosarcoma ([place])   . Wears dentures     Past Surgical History:  Procedure Laterality Date  . EXCISION NASAL MASS Right 07/02/2017   Procedure: EXCISION NASAL MASS;  Surgeon: Izora Gala, MD;  Location: [place];  Service: ENT;  Laterality: Right;  Right nasal endoscopy with biopsy of right nasal mass  . MULTIPLE TOOTH EXTRACTIONS    . TUMOR EXCISION     parathyroidectomy    Prior to Admission medications   Medication Sig Start Date End Date Taking? Authorizing Provider  gabapentin (NEURONTIN) 300 MG capsule Take 300 mg by mouth daily as needed (nerve pain/tingling).  09/18/17 11/07/18 Yes [provider]  lidocaine (LIDODERM) 5 %  Place 1 patch onto the skin daily as needed (pain).  05/17/18  Yes [provider]  morphine (MS CONTIN) 60 MG 12 hr tablet Take 60 mg by mouth every 12 (twelve) hours as needed for pain.  06/11/18  Yes [provider]  ondansetron (ZOFRAN-ODT) 4 MG disintegrating tablet Take 4 mg by mouth every 8 (eight) hours as needed for nausea or vomiting.   Yes [provider]  oxyCODONE (OXY IR/ROXICODONE) 5 MG immediate release tablet Take 5-10 mg by mouth every 4 (four) hours as needed for pain. 10/09/18  Yes [provider]    Scheduled Meds: . lidocaine  1 application Urethral Once   Continuous Infusions: PRN Meds:.  Allergies as of 11/07/2018  . (No Known Allergies)    Family History  Problem Relation Age of Onset  . Diabetes Mother   . Heart attack Mother   . Hypertension Mother   . Heart attack Father     Social History   Socioeconomic History  . Marital status: Married    Spouse name: Not on file  . Number of children: Not on file  . Years of education: Not on file  . Highest education level: Not on file  Occupational History  . Not on file  Social Needs  . Financial resource strain: Not on file  . Food insecurity    Worry: Not on file    Inability: Not on file  . Transportation needs    Medical: Not on  file    Non-medical: Not on file  Tobacco Use  . Smoking status: Current Every Day Smoker    Packs/day: 0.25    Years: 35.00    Pack years: 8.75    Types: Cigarettes  . Smokeless tobacco: Never Used  . Tobacco comment: pt to get a PCP to discuss options  Substance and Sexual Activity  . Alcohol use: Yes    Alcohol/week: 1.0 standard drinks    Types: 1 Standard drinks or equivalent per week    Comment: rare  . Drug use: Never  . Sexual activity: Not Currently    Partners: Female  Lifestyle  . Physical activity    Days per week: Not on file    Minutes per session: Not on file  . Stress: Not on file  Relationships  . Social  Herbalist on phone: Not on file    Gets together: Not on file    Attends religious service: Not on file    Active member of club or organization: Not on file    Attends meetings of clubs or organizations: Not on file    Relationship status: Not on file  . Intimate partner violence    Fear of current or ex partner: Not on file    Emotionally abused: Not on file    Physically abused: Not on file    Forced sexual activity: Not on file  Other Topics Concern  . Not on file  Social History Narrative  . Not on file    Review of Systems: All negative except as stated above in HPI.  Physical Exam: Vital signs: Vitals:   11/07/18 1336 11/07/18 1352  BP: (!) 143/86 (!) 134/93  Pulse: (!) 102 (!) 106  Resp: 16 16  Temp: 98.7 F (37.1 C) (!) 97.5 F (36.4 C)  SpO2: 99% 100%     Physical Exam  Constitutional: He is oriented to person, place, and time. He appears distressed.  Cachectic appearing patient in mild distress  HENT:  Head: Normocephalic and atraumatic.  Mouth/Throat: Oropharynx is clear and moist. No oropharyngeal exudate.  Poor oral hygiene  Eyes: EOM are normal. No scleral icterus.  Neck: Normal range of motion. Neck supple.  Cardiovascular: Regular rhythm.  Tachycardia noted  Pulmonary/Chest: Effort normal. No respiratory distress.  Anterior exam only  Abdominal: Soft. Bowel sounds are normal.  Generalized abdominal discomfort.  No peritoneal signs  Musculoskeletal: Normal range of motion.        General: No edema.  Neurological: He is alert and oriented to person, place, and time.  Skin: Skin is dry. No erythema.  Psychiatric: He has a normal mood and affect. Judgment normal.    GI:  Lab Results: Recent Labs    11/07/18 1021  WBC 3.3*  HGB 6.4*  HCT 19.4*  PLT 10*   BMET Recent Labs    11/07/18 1021  NA 130*  K 5.3*  CL 88*  CO2 19*  GLUCOSE 149*  BUN 183*  CREATININE 6.75*  CALCIUM 9.5   LFT Recent Labs    11/07/18 1021   PROT 7.7  ALBUMIN 3.8  AST 44*  ALT 11  ALKPHOS 63  BILITOT 0.4   PT/INR No results for input(s): LABPROT, INR in the last 72 hours.   Studies/Results: Ct Abdomen Pelvis Wo Contrast  Result Date: 11/07/2018 CLINICAL DATA:  Rectal bleeding. 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 EXAM: CT ABDOMEN AND PELVIS WITHOUT CONTRAST TECHNIQUE: Multidetector CT imaging of the  abdomen and pelvis was performed following the standard protocol without IV contrast. COMPARISON:  None. FINDINGS: Lower chest: Partially visualized small right anterior basal pneumothorax. Partially visualized moderate right pleural effusion with partial collapse of the right lower lobe. Asymmetric soft tissue density involving the lower right anterior chest wall adjacent to the anterior fourth rib, with cortical irregularity of the rib and involvement of the anterior pleural surface (series 2, image 2). Hepatobiliary: No focal liver abnormality. No gallbladder wall thickening, distention, or surrounding inflammatory changes. Pancreas: Unremarkable. No pancreatic ductal dilatation or surrounding inflammatory changes. Spleen: Atrophic.  No focal abnormality. Adrenals/Urinary Tract: Adrenal glands are unremarkable. Severe right hydronephrosis to the level of the UPJ with minimal residual right renal parenchyma. Moderate left hydroureteronephrosis without obstructing calculi. 4 mm nonobstructive calculus in the midpole of the left kidney. Markedly distended bladder with a few diverticula. Stomach/Bowel: Stomach is within normal limits. Appendix is not identified. No evidence of bowel wall thickening, distention, or inflammatory changes. Moderately increased colonic stool burden. Vascular/Lymphatic: Aortic atherosclerosis. No enlarged abdominal or pelvic lymph nodes. Reproductive: Prostate is normal in size.  Possible TURP defect. Other: No abdominal wall hernia or abnormality. No abdominopelvic ascites. No pneumoperitoneum.  Musculoskeletal: Patchy sclerosis throughout the visualized thoracolumbar spine which may be related to chemotherapy. 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 IMPRESSION: 1. Partially visualized small right anterior basal pneumothorax and moderate right pleural effusion. 2. Asymmetric soft tissue density involving the lower right anterior chest wall and anterior fourth rib, with involvement of the anterior pleural surface, concerning for metastatic disease. 3. 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 4. Moderate left hydroureteronephrosis without definite obstructing lesion. Given markedly distended bladder, findings are likely related to urinary retention. 5. Nonobstructive left nephrolithiasis. 6. Moderately increased colonic stool burden.  No obstruction. 7.  Aortic atherosclerosis (ICD10-I70.0). Electronically Signed   By: Titus Dubin M.D.   On: 11/07/2018 13:26    Impression/Plan: -Rectal bleeding in setting of significantly low platelet count at 10.  Most likely diffuse mucosal bleeding. -Hematemesis.  Streaks of blood in the vomiting intermittently for last 1 week.  This could be from mucosal bleeding from low platelet count. 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  Most likely he is having diffuse mucosal bleeding from low platelet counts. -Recommend supportive care with PRBC and platelet transfusion. -Recommend hematology consultation. -Not much to offer from GI standpoint but GI will follow.    LOS: 0 days   Otis Brace  MD, [employer] 11/07/2018, 1:53 PM  Contact #  [phone]

## 2018-11-07 NOTE — H&P (Signed)
History and Physical    Bruce Lynch [id number] DOB: [birth year] DOA: 11/07/2018  PCP: Donella Stade, PA-C Patient coming from: Home  I have personally briefly reviewed patient's old medical records in [place]  Chief Complaint: Rectal bleeding  HPI: Bruce Lynch is a 48 y.o. male with medical history significant of metastatic sinonasal/parameningeal rhabdomyosarcoma follows up with Dr. Caleen Jobs, was on hospice until this morning was brought by his wife to [place] long ED for abdominal discomfort and rectal bleeding.  Patient is a poor historian and he is in moderate distress from neck pain and most of the history was available from the patient's wife at bedside.  As per the wife patient has been in pain has been clinically deteriorating over the last 2 weeks, with minimal oral intake and unable to urinate.  She reports hospice have not been able to help him relieve his pain so she brought the patient to the hospital.  There were no fevers or chills but had occasional hematemesis associated with some nausea and bright red blood per rectum since this morning.   ED Course:  On arrival to the ED patient is afebrile tachycardic slightly hypertensive with blood pressure of 144 over 98.  Source significant for sodium of 130, potassium of 5.3, chloride of 88 bicarb of 19 BUN of 183, creatinine of 6.75, AST of 44 RBC of 6.4, WBC of 3.3, platelets of 10, COVID-19 screening test negative.  CT of the abdomen and pelvis without contrast showed Asymmetric soft tissue density involving the lower right anterior chest wall and anterior fourth rib, with involvement of the anterior pleural surface, concerning for metastatic disease.  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 Moderate left hydroureteronephrosis without definite obstructing lesion. Given markedly distended bladder, findings are likely related to urinary  retention.  X-ray showed moderate right pleural effusion.  She was referred to medical service for admission and gastroenterology was consulted for rectal bleed.    Review of Systems: ROS could not be obtained from the patient  Past Medical History:  Diagnosis Date  . Allergy   . Bone metastasis ([place])   . Cancer ([place])   . Heart attack ([place])   . Mass of nasal sinus   . Rhabdomyosarcoma ([place])   . Wears dentures     Past Surgical History:  Procedure Laterality Date  . EXCISION NASAL MASS Right 07/02/2017   Procedure: EXCISION NASAL MASS;  Surgeon: Izora Gala, MD;  Location: [place];  Service: ENT;  Laterality: Right;  Right nasal endoscopy with biopsy of right nasal mass  . MULTIPLE TOOTH EXTRACTIONS    . TUMOR EXCISION     parathyroidectomy   Social history  reports that he has been smoking cigarettes. He has a 8.75 pack-year smoking history. He has never used smokeless tobacco. He reports current alcohol use of about 1.0 standard drinks of alcohol per week. He reports that he does not use drugs.  No Known Allergies  Family History  Problem Relation Age of Onset  . Diabetes Mother   . Heart attack Mother   . Hypertension Mother   . Heart attack Father    Family history reviewed and not pertinent  Prior to Admission medications   Medication Sig Start Date End Date Taking? Authorizing Provider  gabapentin (NEURONTIN) 300 MG capsule Take 300 mg by mouth daily as needed (nerve pain/tingling).  09/18/17 11/07/18 Yes [provider]  lidocaine (LIDODERM) 5 % Place 1 patch  onto the skin daily as needed (pain).  05/17/18  Yes [provider]  morphine (MS CONTIN) 60 MG 12 hr tablet Take 60 mg by mouth every 12 (twelve) hours as needed for pain.  06/11/18  Yes [provider]  ondansetron (ZOFRAN-ODT) 4 MG disintegrating tablet Take 4 mg by mouth every 8 (eight) hours as needed for nausea or vomiting.   Yes [provider]  oxyCODONE (OXY  IR/ROXICODONE) 5 MG immediate release tablet Take 5-10 mg by mouth every 4 (four) hours as needed for pain. 10/09/18  Yes [provider]    Physical Exam: Vitals:   11/07/18 1545 11/07/18 1600 11/07/18 1615 11/07/18 1624  BP:  (!) 144/98  (!) 144/98  Pulse: 98 (!) 102 98 (!) 105  Resp:  16  16  Temp:    97.8 F (36.6 C)  TempSrc:    Oral  SpO2: 100% 100% 100%   Weight:      Height:        Constitutional: Cachectic looking gentleman in moderate distress from pain Vitals:   11/07/18 1545 11/07/18 1600 11/07/18 1615 11/07/18 1624  BP:  (!) 144/98  (!) 144/98  Pulse: 98 (!) 102 98 (!) 105  Resp:  16  16  Temp:    97.8 F (36.6 C)  TempSrc:    Oral  SpO2: 100% 100% 100%   Weight:      Height:       Eyes: Pale conjunctiva ENMT: Mucous membranes are dry Neck: Tender neck Respiratory: clear to auscultation the upper lobes, tachypneic Cardiovascular: Tachycardic Abdomen: Soft, nontender Musculoskeletal: Poor muscle tone, no pedal edema Neurologic: Patient was in moderate distress from pain detailed neuro exam could not be performed. He is moaning in pain.    Labs on Admission: I have personally reviewed following labs and imaging studies  CBC: Recent Labs  Lab 11/07/18 1021  WBC 3.3*  NEUTROABS 2.3  HGB 6.4*  HCT 19.4*  MCV 100.0  PLT 10*   Basic Metabolic Panel: Recent Labs  Lab 11/07/18 1021  NA 130*  K 5.3*  CL 88*  CO2 19*  GLUCOSE 149*  BUN 183*  CREATININE 6.75*  CALCIUM 9.5   GFR: Estimated Creatinine Clearance: 11.6 mL/min (A) (by C-G formula based on SCr of 6.75 mg/dL (H)). Liver Function Tests: Recent Labs  Lab 11/07/18 1021  AST 44*  ALT 11  ALKPHOS 63  BILITOT 0.4  PROT 7.7  ALBUMIN 3.8   Recent Labs  Lab 11/07/18 1021  LIPASE 35   No results for input(s): AMMONIA in the last 168 hours. Coagulation Profile: No results for input(s): INR, PROTIME in the last 168 hours. Cardiac Enzymes: No results for input(s): CKTOTAL,  CKMB, CKMBINDEX, TROPONINI in the last 168 hours. BNP (last 3 results) No results for input(s): PROBNP in the last 8760 hours. HbA1C: No results for input(s): HGBA1C in the last 72 hours. CBG: No results for input(s): GLUCAP in the last 168 hours. Lipid Profile: No results for input(s): CHOL, HDL, LDLCALC, TRIG, CHOLHDL, LDLDIRECT in the last 72 hours. Thyroid Function Tests: No results for input(s): TSH, T4TOTAL, FREET4, T3FREE, THYROIDAB in the last 72 hours. Anemia Panel: No results for input(s): VITAMINB12, FOLATE, FERRITIN, TIBC, IRON, RETICCTPCT in the last 72 hours. Urine analysis:    Component Value Date/Time   COLORURINE RED (A) 11/07/2018 1234   APPEARANCEUR TURBID (A) 11/07/2018 1234   LABSPEC  11/07/2018 1234    TEST NOT REPORTED DUE TO COLOR  INTERFERENCE OF URINE PIGMENT   PHURINE  11/07/2018 1234    TEST NOT REPORTED DUE TO COLOR INTERFERENCE OF URINE PIGMENT   GLUCOSEU (A) 11/07/2018 1234    TEST NOT REPORTED DUE TO COLOR INTERFERENCE OF URINE PIGMENT   HGBUR (A) 11/07/2018 1234    TEST NOT REPORTED DUE TO COLOR INTERFERENCE OF URINE PIGMENT   BILIRUBINUR (A) 11/07/2018 1234    TEST NOT REPORTED DUE TO COLOR INTERFERENCE OF URINE PIGMENT   KETONESUR (A) 11/07/2018 1234    TEST NOT REPORTED DUE TO COLOR INTERFERENCE OF URINE PIGMENT   PROTEINUR (A) 11/07/2018 1234    TEST NOT REPORTED DUE TO COLOR INTERFERENCE OF URINE PIGMENT   NITRITE (A) 11/07/2018 1234    TEST NOT REPORTED DUE TO COLOR INTERFERENCE OF URINE PIGMENT   LEUKOCYTESUR (A) 11/07/2018 1234    TEST NOT REPORTED DUE TO COLOR INTERFERENCE OF URINE PIGMENT    Radiological Exams on Admission: Ct Abdomen Pelvis Wo Contrast  Result Date: 11/07/2018 CLINICAL DATA:  Rectal bleeding. 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 EXAM: CT ABDOMEN AND PELVIS WITHOUT CONTRAST TECHNIQUE: Multidetector CT imaging of the abdomen and pelvis was performed following the standard protocol without IV contrast.  COMPARISON:  None. FINDINGS: Lower chest: Partially visualized small right anterior basal pneumothorax. Partially visualized moderate right pleural effusion with partial collapse of the right lower lobe. Asymmetric soft tissue density involving the lower right anterior chest wall adjacent to the anterior fourth rib, with cortical irregularity of the rib and involvement of the anterior pleural surface (series 2, image 2). Hepatobiliary: No focal liver abnormality. No gallbladder wall thickening, distention, or surrounding inflammatory changes. Pancreas: Unremarkable. No pancreatic ductal dilatation or surrounding inflammatory changes. Spleen: Atrophic.  No focal abnormality. Adrenals/Urinary Tract: Adrenal glands are unremarkable. Severe right hydronephrosis to the level of the UPJ with minimal residual right renal parenchyma. Moderate left hydroureteronephrosis without obstructing calculi. 4 mm nonobstructive calculus in the midpole of the left kidney. Markedly distended bladder with a few diverticula. Stomach/Bowel: Stomach is within normal limits. Appendix is not identified. No evidence of bowel wall thickening, distention, or inflammatory changes. Moderately increased colonic stool burden. Vascular/Lymphatic: Aortic atherosclerosis. No enlarged abdominal or pelvic lymph nodes. Reproductive: Prostate is normal in size.  Possible TURP defect. Other: No abdominal wall hernia or abnormality. No abdominopelvic ascites. No pneumoperitoneum. Musculoskeletal: Patchy sclerosis throughout the visualized thoracolumbar spine which may be related to chemotherapy. 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 IMPRESSION: 1. Partially visualized small right anterior basal pneumothorax and moderate right pleural effusion. 2. Asymmetric soft tissue density involving the lower right anterior chest wall and anterior fourth rib, with involvement of the anterior pleural surface, concerning for metastatic  disease. 3. 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 4. Moderate left hydroureteronephrosis without definite obstructing lesion. Given markedly distended bladder, findings are likely related to urinary retention. 5. Nonobstructive left nephrolithiasis. 6. Moderately increased colonic stool burden.  No obstruction. 7.  Aortic atherosclerosis (ICD10-I70.0). Electronically Signed   By: Titus Dubin M.D.   On: 11/07/2018 13:26   Dg Chest Port 1 View  Result Date: 11/07/2018 CLINICAL DATA:  Rectal bleeding. EXAM: PORTABLE CHEST 1 VIEW COMPARISON:  June 19, 2018 FINDINGS: There is a stable left-sided Port-A-Cath terminating in the duplicated SVC. There is a probable skin fold over the lateral upper chest no convincing evidence of pneumothorax. There is haziness over the right lower lung. There appears to  be some pleural thickening laterally in the right upper chest. The cardiomediastinal silhouette is stable. The left lung is clear. IMPRESSION: 1. Haziness over the right chest is new and could represent loculated fluid, an underlying pulmonary infiltrate, or something on the patient or in the chest wall. There is also some pleural thickening laterally in the right upper chest. A PA and lateral chest x-ray may better evaluate. Alternatively, a CT scan could further evaluate. 2. There is a skin fold over the upper right lateral chest but no convincing evidence of pneumothorax. 3. No other acute abnormalities. Electronically Signed   By: Dorise Bullion III M.D   On: 11/07/2018 14:02    EKG: Not done  Assessment/Plan Active Problems:   Pancytopenia [facility])    Metastatic sinonasal/parameningeal rhabdomyosarcoma No further treatment as per her oncologist and patient is on home hospice.  On further discussion with his wife they would like him to be transitioned to residential hospice.  Patient is wife does not want any further treatment  with platelet transfusion or blood transfusions at this time.  She would like him to be comfortable and the main focus is on comfort.  Social work consulted for residential hospice placement.  AKI with bilateral hydronephrosis Foley catheter placed and gently hydrate the patient.   Pancytopenia probably secondary to chemo and metastatic cancer He was ordered a unit of platelets and 1 unit of PRBC transfusion. Transition to residential hospice when bed available.   In view of his clinical deterioration over the last 2 weeks,  In adequate pain control, palliative care consulted for symptom management.  DVT prophylaxis: hospice  Code Status: DNR Family Communication: Discussed with wife at bedside.  Disposition Plan: pending social work.  Consults called: palliative care Admission status: obs/ med surg.    Hosie Poisson MD [facility] Pager [phone]   If 7PM-7AM, please contact night-coverage [email or web address] Password [email or web address]  11/07/2018, 4:26 PM

## 2018-11-07 NOTE — Progress Notes (Signed)
Received report. Awaiting pt arrival to floor. Clarified airborne and contact precautions have been discontinued prior to admission to floor.

## 2018-11-07 NOTE — ED Notes (Signed)
Patient transported to CT 

## 2018-11-07 NOTE — ED Triage Notes (Signed)
EMS states pt had a small amount of rectal bleeding last night. This morning the rectal bleeding was pronounced and the family called EMS. Pt complains of left sided abdominal pain. Pt states has not had a bowel movement in 2 weeks.  BP: 132/96 HR: 104 RR: 14 SpO2: 99 Temp: 97.3

## 2018-11-07 NOTE — Progress Notes (Signed)
Hospice pt, EOL care

## 2018-11-07 NOTE — ED Notes (Signed)
ED TO INPATIENT HANDOFF REPORT  Name/Age/Gender Bruce Lynch 48 y.o. male  Code Status Advance Directive Documentation     Most Recent Value  Type of Advance Directive  Out of facility DNR (pink MOST or yellow form)  Pre-existing out of facility DNR order (yellow form or pink MOST form)  Yellow form placed in chart (order not valid for inpatient use)  "MOST" Form in Place?  -      Home/SNF/Other Home  Chief Complaint rectal bleeding  Level of Care/Admitting Diagnosis ED Disposition    ED Disposition Condition [place]: [place] [100102]  Level of Care: Med-Surg [16]  Covid Evaluation: Asymptomatic Screening Protocol (No Symptoms)  Diagnosis: Pancytopenia ([place]) [id number]  Admitting Physician: Hosie Poisson [[id number]]  Attending Physician: Hosie Poisson [[id number]]  PT Class (Do Not Modify): Observation [104]  PT Acc Code (Do Not Modify): Observation [10022]       Medical History Past Medical History:  Diagnosis Date  . Allergy   . Bone metastasis ([place])   . Cancer ([place])   . Heart attack ([place])   . Mass of nasal sinus   . 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 Wears dentures     Allergies No Known Allergies  IV Location/Drains/Wounds Patient Lines/Drains/Airways Status   Active Line/Drains/Airways    Name:   Placement date:   Placement time:   Site:   Days:   Peripheral IV 11/07/18 Left Forearm   11/07/18    1047    Forearm   less than 1   Urethral Catheter Royetta Car, RN Straight-tip 16 Fr.   11/07/18    1307    Straight-tip   less than 1   Incision (Closed) 07/02/17 Nose Right   07/02/17    1242     493          Labs/Imaging Results for orders placed or performed during the hospital encounter of 11/07/18 (from the past 48 hour(s))  CBC with Differential     Status: Abnormal   Collection Time: 11/07/18 10:21 AM  Result Value Ref Range   WBC 3.3 (L) 4.0 - 10.5 K/uL   RBC 1.94 (L) 4.22 - 5.81 MIL/uL   Hemoglobin 6.4 (LL) 13.0  - 17.0 g/dL    Comment: REPEATED TO VERIFY THIS CRITICAL RESULT HAS VERIFIED AND BEEN CALLED TO K ZULETA,RN BY JACQUELINE HOLMES ON 08 29 2020 AT 1152, AND HAS BEEN READ BACK. CRITICAL RESULTS VERIFIED    HCT 19.4 (L) 39.0 - 52.0 %   MCV 100.0 80.0 - 100.0 fL   MCH 33.0 26.0 - 34.0 pg   MCHC 33.0 30.0 - 36.0 g/dL   RDW 18.6 (H) 11.5 - 15.5 %   Platelets 10 (LL) 150 - 400 K/uL    Comment: REPEATED TO VERIFY PLATELET COUNT CONFIRMED BY SMEAR SPECIMEN CHECKED FOR CLOTS Immature Platelet Fraction may be clinically indicated, consider ordering this additional test [id number]    nRBC 9.2 (H) 0.0 - 0.2 %   Neutrophils Relative % 71 %   Neutro Abs 2.3 1.7 - 7.7 K/uL   Lymphocytes Relative 12 %   Lymphs Abs 0.4 (L) 0.7 - 4.0 K/uL   Monocytes Relative 12 %   Monocytes Absolute 0.4 0.1 - 1.0 K/uL   Eosinophils Relative 1 %   Eosinophils Absolute 0.0 0.0 - 0.5 K/uL   Basophils Relative 0 %   Basophils Absolute 0.0 0.0 - 0.1 K/uL   WBC Morphology MILD LEFT SHIFT (1-5%  METAS, OCC MYELO, OCC BANDS)    Immature Granulocytes 4 %   Abs Immature Granulocytes 0.12 (H) 0.00 - 0.07 K/uL   Polychromasia PRESENT     Comment: Performed at [facility], [place] [address], [place], [place] [address]  Comprehensive metabolic panel     Status: Abnormal   Collection Time: 11/07/18 10:21 AM  Result Value Ref Range   Sodium 130 (L) 135 - 145 mmol/L    Comment: REPEATED TO VERIFY   Potassium 5.3 (H) 3.5 - 5.1 mmol/L    Comment: REPEATED TO VERIFY NO VISIBLE HEMOLYSIS    Chloride 88 (L) 98 - 111 mmol/L    Comment: REPEATED TO VERIFY   CO2 19 (L) 22 - 32 mmol/L    Comment: REPEATED TO VERIFY   Glucose, Bld 149 (H) 70 - 99 mg/dL   BUN 183 (H) 6 - 20 mg/dL    Comment: RESULTS CONFIRMED BY MANUAL DILUTION   Creatinine, Ser 6.75 (H) 0.61 - 1.24 mg/dL   Calcium 9.5 8.9 - 10.3 mg/dL   Total Protein 7.7 6.5 - 8.1 g/dL   Albumin 3.8 3.5 - 5.0 g/dL   AST 44 (H) 15 - 41 U/L   ALT 11 0 - 44  U/L   Alkaline Phosphatase 63 38 - 126 U/L   Total Bilirubin 0.4 0.3 - 1.2 mg/dL   GFR calc non Af Amer 9 (L) >60 mL/min   GFR calc Af Amer 10 (L) >60 mL/min   Anion gap 23 (H) 5 - 15    Comment: REPEATED TO VERIFY Performed at [facility], [place] [address], [place], [place] [address]   Lipase, blood     Status: None   Collection Time: 11/07/18 10:21 AM  Result Value Ref Range   Lipase 35 11 - 51 U/L    Comment: Performed at [facility], [place] [address] [place], [place] [address]  Type and screen     Status: None (Preliminary result)   Collection Time: 11/07/18 10:22 AM  Result Value Ref Range   ABO/RH(D) A POS    Antibody Screen NEG    Sample Expiration 11/10/2018,2359    Unit Number [id number]    Blood Component Type RED CELLS,LR    Unit division 00    Status of Unit ISSUED    Transfusion Status OK TO TRANSFUSE    Crossmatch Result      Compatible Performed at [place] [address], [place], [place] [address]    Unit Number [id number]    Blood Component Type RED CELLS,LR    Unit division 00    Status of Unit ISSUED    Transfusion Status OK TO TRANSFUSE    Crossmatch Result Compatible   ABO/Rh     Status: None (Preliminary result)   Collection Time: 11/07/18 10:22 AM  Result Value Ref Range   ABO/RH(D)      A POS Performed at [facility], [place] [address] [place], [place] [address]   Prepare pheresed platelets     Status: None (Preliminary result)   Collection Time: 11/07/18 12:04 PM  Result Value Ref Range   Unit Number [id number]    Blood Component Type PLTP LR1 PAS    Unit division 00    Status of Unit ALLOCATED    Transfusion Status OK TO TRANSFUSE   Prepare RBC     Status: None   Collection Time: 11/07/18 12:05 PM  Result Value Ref Range  Order Confirmation      ORDER PROCESSED BY BLOOD BANK Performed at [facility], [place] [address],  [place], [place] [address]   Urinalysis, Routine w reflex microscopic     Status: Abnormal   Collection Time: 11/07/18 12:34 PM  Result Value Ref Range   Color, Urine RED (A) YELLOW    Comment: BIOCHEMICALS MAY BE AFFECTED BY COLOR   APPearance TURBID (A) CLEAR   Specific Gravity, Urine  1.005 - 1.030    TEST NOT REPORTED DUE TO COLOR INTERFERENCE OF URINE PIGMENT   pH  5.0 - 8.0    TEST NOT REPORTED DUE TO COLOR INTERFERENCE OF URINE PIGMENT   Glucose, UA (A) NEGATIVE mg/dL    TEST NOT REPORTED DUE TO COLOR INTERFERENCE OF URINE PIGMENT   Hgb urine dipstick (A) NEGATIVE    TEST NOT REPORTED DUE TO COLOR INTERFERENCE OF URINE PIGMENT   Bilirubin Urine (A) NEGATIVE    TEST NOT REPORTED DUE TO COLOR INTERFERENCE OF URINE PIGMENT   Ketones, ur (A) NEGATIVE mg/dL    TEST NOT REPORTED DUE TO COLOR INTERFERENCE OF URINE PIGMENT   Protein, ur (A) NEGATIVE mg/dL    TEST NOT REPORTED DUE TO COLOR INTERFERENCE OF URINE PIGMENT   Nitrite (A) NEGATIVE    TEST NOT REPORTED DUE TO COLOR INTERFERENCE OF URINE PIGMENT   Leukocytes,Ua (A) NEGATIVE    TEST NOT REPORTED DUE TO COLOR INTERFERENCE OF URINE PIGMENT    Comment: Performed at [facility], [place] [address], [place], [place] [address]  Urinalysis, Microscopic (reflex)     Status: Abnormal   Collection Time: 11/07/18 12:34 PM  Result Value Ref Range   RBC / HPF >50 0 - 5 RBC/hpf   WBC, UA 21-50 0 - 5 WBC/hpf   Bacteria, UA RARE (A) NONE SEEN   Squamous Epithelial / LPF 0-5 0 - 5   Non Squamous Epithelial PRESENT (A) NONE SEEN   RBC Casts, UA PRESENT     Comment: Performed at [facility], [place] [address], [place], [place] [address]  SARS Coronavirus 2 [facility] order, Performed in [facility] hospital lab) Nasopharyngeal Nasopharyngeal Swab     Status: None   Collection Time: 11/07/18  1:36 PM   Specimen: Nasopharyngeal Swab  Result Value Ref Range   SARS Coronavirus 2 NEGATIVE NEGATIVE    Comment:  (NOTE) If result is NEGATIVE SARS-CoV-2 target nucleic acids are NOT DETECTED. The SARS-CoV-2 RNA is generally detectable in upper and lower  respiratory specimens during the acute phase of infection. The lowest  concentration of SARS-CoV-2 viral copies this assay can detect is 250  copies / mL. A negative result does not preclude SARS-CoV-2 infection  and should not be used as the sole basis for treatment or other  patient management decisions.  A negative result may occur with  improper specimen collection / handling, submission of specimen other  than nasopharyngeal swab, presence of viral mutation(s) within the  areas targeted by this assay, and inadequate number of viral copies  (<250 copies / mL). A negative result must be combined with clinical  observations, patient history, and epidemiological information. If result is POSITIVE SARS-CoV-2 target nucleic acids are DETECTED. The SARS-CoV-2 RNA is generally detectable in upper and lower  respiratory specimens dur ing the acute phase of infection.  Positive  results are indicative of active infection with SARS-CoV-2.  Clinical  correlation with patient history and other diagnostic information is  necessary  to determine patient infection status.  Positive results do  not rule out bacterial infection or co-infection with other viruses. If result is PRESUMPTIVE POSTIVE SARS-CoV-2 nucleic acids MAY BE PRESENT.   A presumptive positive result was obtained on the submitted specimen  and confirmed on repeat testing.  While 2019 novel coronavirus  (SARS-CoV-2) nucleic acids may be present in the submitted sample  additional confirmatory testing may be necessary for epidemiological  and / or clinical management purposes  to differentiate between  SARS-CoV-2 and other Sarbecovirus currently known to infect humans.  If clinically indicated additional testing with an alternate test  methodology [phone]) is advised. The SARS-CoV-2 RNA is  generally  detectable in upper and lower respiratory sp ecimens during the acute  phase of infection. The expected result is Negative. Fact Sheet for Patients:  StrictlyIdeas.no Fact Sheet for Healthcare Providers: BankingDealers.co.za This test is not yet approved or cleared by the [place] FDA and has been authorized for detection and/or diagnosis of SARS-CoV-2 by FDA under an Emergency Use Authorization (EUA).  This EUA will remain in effect (meaning this test can be used) for the duration of the COVID-19 declaration under Section 564(b)(1) of the Act, 21 U.S.C. section 360bbb-3(b)(1), unless the authorization is terminated or revoked sooner. Performed at [facility], [place] [address], [place], [place] [address]    Ct Abdomen Pelvis Wo Contrast  Result Date: 11/07/2018 CLINICAL DATA:  Rectal bleeding. 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 EXAM: CT ABDOMEN AND PELVIS WITHOUT CONTRAST TECHNIQUE: Multidetector CT imaging of the abdomen and pelvis was performed following the standard protocol without IV contrast. COMPARISON:  None. FINDINGS: Lower chest: Partially visualized small right anterior basal pneumothorax. Partially visualized moderate right pleural effusion with partial collapse of the right lower lobe. Asymmetric soft tissue density involving the lower right anterior chest wall adjacent to the anterior fourth rib, with cortical irregularity of the rib and involvement of the anterior pleural surface (series 2, image 2). Hepatobiliary: No focal liver abnormality. No gallbladder wall thickening, distention, or surrounding inflammatory changes. Pancreas: Unremarkable. No pancreatic ductal dilatation or surrounding inflammatory changes. Spleen: Atrophic.  No focal abnormality. Adrenals/Urinary Tract: Adrenal glands are unremarkable. Severe right hydronephrosis to the level of the UPJ with minimal residual right  renal parenchyma. Moderate left hydroureteronephrosis without obstructing calculi. 4 mm nonobstructive calculus in the midpole of the left kidney. Markedly distended bladder with a few diverticula. Stomach/Bowel: Stomach is within normal limits. Appendix is not identified. No evidence of bowel wall thickening, distention, or inflammatory changes. Moderately increased colonic stool burden. Vascular/Lymphatic: Aortic atherosclerosis. No enlarged abdominal or pelvic lymph nodes. Reproductive: Prostate is normal in size.  Possible TURP defect. Other: No abdominal wall hernia or abnormality. No abdominopelvic ascites. No pneumoperitoneum. Musculoskeletal: Patchy sclerosis throughout the visualized thoracolumbar spine which may be related to chemotherapy. 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 IMPRESSION: 1. Partially visualized small right anterior basal pneumothorax and moderate right pleural effusion. 2. Asymmetric soft tissue density involving the lower right anterior chest wall and anterior fourth rib, with involvement of the anterior pleural surface, concerning for metastatic disease. 3. 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 4. Moderate left hydroureteronephrosis without definite obstructing lesion. Given markedly distended bladder, findings are likely related to urinary retention. 5. Nonobstructive left nephrolithiasis. 6. Moderately increased colonic stool burden.  No obstruction. 7.  Aortic atherosclerosis (ICD10-I70.0). Electronically Signed   By: Orville Govern.D.  On: 11/07/2018 13:26   Dg Chest Port 1 View  Result Date: 11/07/2018 CLINICAL DATA:  Rectal bleeding. EXAM: PORTABLE CHEST 1 VIEW COMPARISON:  June 19, 2018 FINDINGS: There is a stable left-sided Port-A-Cath terminating in the duplicated SVC. There is a probable skin fold over the lateral upper chest no convincing evidence of  pneumothorax. There is haziness over the right lower lung. There appears to be some pleural thickening laterally in the right upper chest. The cardiomediastinal silhouette is stable. The left lung is clear. IMPRESSION: 1. Haziness over the right chest is new and could represent loculated fluid, an underlying pulmonary infiltrate, or something on the patient or in the chest wall. There is also some pleural thickening laterally in the right upper chest. A PA and lateral chest x-ray may better evaluate. Alternatively, a CT scan could further evaluate. 2. There is a skin fold over the upper right lateral chest but no convincing evidence of pneumothorax. 3. No other acute abnormalities. Electronically Signed   By: Dorise Bullion III M.D   On: 11/07/2018 14:02    Pending Labs Unresulted Labs (From admission, onward)    Start     Ordered   11/07/18 1359  DIC panel  ONCE - STAT,   STAT     11/07/18 1358   Signed and Held  HIV antibody (Routine Testing)  Once,   R     Signed and Held          Vitals/Pain Today's Vitals   11/07/18 1545 11/07/18 1600 11/07/18 1615 11/07/18 1624  BP:  (!) 144/98  (!) 144/98  Pulse: 98 (!) 102 98 (!) 105  Resp:  16  16  Temp:    97.8 F (36.6 C)  TempSrc:    Oral  SpO2: 100% 100% 100%   Weight:      Height:      PainSc:        Isolation Precautions No active isolations  Medications Medications  sodium chloride 0.9 % bolus 1,000 mL (0 mLs Intravenous Stopped 11/07/18 1221)  0.9 %  sodium chloride infusion (Manually program via Guardrails IV Fluids) ( Intravenous New Bag/Given 11/07/18 1341)  morphine 4 MG/ML injection 4 mg (4 mg Intravenous Given 11/07/18 1325)  ondansetron (ZOFRAN) injection 4 mg (4 mg Intravenous Given 11/07/18 1325)    Mobility non-ambulatory

## 2018-11-07 NOTE — ED Notes (Signed)
Report called to Kaitlyn, RN

## 2018-11-08 DIAGNOSIS — K625 Hemorrhage of anus and rectum: Secondary | ICD-10-CM | POA: Diagnosis not present

## 2018-11-08 DIAGNOSIS — T451X5A Adverse effect of antineoplastic and immunosuppressive drugs, initial encounter: Secondary | ICD-10-CM | POA: Diagnosis present

## 2018-11-08 DIAGNOSIS — J90 Pleural effusion, not elsewhere classified: Secondary | ICD-10-CM | POA: Diagnosis present

## 2018-11-08 DIAGNOSIS — Z8249 Family history of ischemic heart disease and other diseases of the circulatory system: Secondary | ICD-10-CM | POA: Diagnosis not present

## 2018-11-08 DIAGNOSIS — E892 Postprocedural hypoparathyroidism: Secondary | ICD-10-CM | POA: Diagnosis present

## 2018-11-08 DIAGNOSIS — R64 Cachexia: Secondary | ICD-10-CM | POA: Diagnosis present

## 2018-11-08 DIAGNOSIS — Z7189 Other specified counseling: Secondary | ICD-10-CM

## 2018-11-08 DIAGNOSIS — Z681 Body mass index (BMI) 19 or less, adult: Secondary | ICD-10-CM | POA: Diagnosis not present

## 2018-11-08 DIAGNOSIS — Z9221 Personal history of antineoplastic chemotherapy: Secondary | ICD-10-CM | POA: Diagnosis not present

## 2018-11-08 DIAGNOSIS — I252 Old myocardial infarction: Secondary | ICD-10-CM | POA: Diagnosis not present

## 2018-11-08 DIAGNOSIS — Z20828 Contact with and (suspected) exposure to other viral communicable diseases: Secondary | ICD-10-CM | POA: Diagnosis present

## 2018-11-08 DIAGNOSIS — D61818 Other pancytopenia: Secondary | ICD-10-CM | POA: Diagnosis not present

## 2018-11-08 DIAGNOSIS — Z66 Do not resuscitate: Secondary | ICD-10-CM

## 2018-11-08 DIAGNOSIS — N132 Hydronephrosis with renal and ureteral calculous obstruction: Secondary | ICD-10-CM | POA: Diagnosis present

## 2018-11-08 DIAGNOSIS — K219 Gastro-esophageal reflux disease without esophagitis: Secondary | ICD-10-CM | POA: Diagnosis present

## 2018-11-08 DIAGNOSIS — R52 Pain, unspecified: Secondary | ICD-10-CM

## 2018-11-08 DIAGNOSIS — Z515 Encounter for palliative care: Secondary | ICD-10-CM | POA: Diagnosis present

## 2018-11-08 DIAGNOSIS — N179 Acute kidney failure, unspecified: Secondary | ICD-10-CM | POA: Diagnosis present

## 2018-11-08 DIAGNOSIS — F17210 Nicotine dependence, cigarettes, uncomplicated: Secondary | ICD-10-CM | POA: Diagnosis present

## 2018-11-08 DIAGNOSIS — G893 Neoplasm related pain (acute) (chronic): Secondary | ICD-10-CM | POA: Diagnosis present

## 2018-11-08 DIAGNOSIS — C490 Malignant neoplasm of connective and soft tissue of head, face and neck: Secondary | ICD-10-CM | POA: Diagnosis present

## 2018-11-08 DIAGNOSIS — C799 Secondary malignant neoplasm of unspecified site: Secondary | ICD-10-CM | POA: Diagnosis not present

## 2018-11-08 DIAGNOSIS — K920 Hematemesis: Secondary | ICD-10-CM | POA: Diagnosis present

## 2018-11-08 DIAGNOSIS — C7951 Secondary malignant neoplasm of bone: Secondary | ICD-10-CM | POA: Diagnosis present

## 2018-11-08 DIAGNOSIS — Z833 Family history of diabetes mellitus: Secondary | ICD-10-CM | POA: Diagnosis not present

## 2018-11-08 DIAGNOSIS — D61811 Other drug-induced pancytopenia: Secondary | ICD-10-CM | POA: Diagnosis present

## 2018-11-08 DIAGNOSIS — I10 Essential (primary) hypertension: Secondary | ICD-10-CM | POA: Diagnosis present

## 2018-11-08 DIAGNOSIS — Z79899 Other long term (current) drug therapy: Secondary | ICD-10-CM | POA: Diagnosis not present

## 2018-11-08 LAB — CBC WITH DIFFERENTIAL/PLATELET
Abs Immature Granulocytes: 0.12 10*3/uL — ABNORMAL HIGH (ref 0.00–0.07)
Basophils Absolute: 0 10*3/uL (ref 0.0–0.1)
Basophils Relative: 0 %
Eosinophils Absolute: 0 10*3/uL (ref 0.0–0.5)
Eosinophils Relative: 1 %
HCT: 19.4 % — ABNORMAL LOW (ref 39.0–52.0)
Hemoglobin: 6.4 g/dL — CL (ref 13.0–17.0)
Immature Granulocytes: 4 %
Lymphocytes Relative: 12 %
Lymphs Abs: 0.4 10*3/uL — ABNORMAL LOW (ref 0.7–4.0)
MCH: 33 pg (ref 26.0–34.0)
MCHC: 33 g/dL (ref 30.0–36.0)
MCV: 100 fL (ref 80.0–100.0)
Monocytes Absolute: 0.4 10*3/uL (ref 0.1–1.0)
Monocytes Relative: 12 %
Neutro Abs: 2.3 10*3/uL (ref 1.7–7.7)
Neutrophils Relative %: 71 %
Platelets: 10 10*3/uL — CL (ref 150–400)
RBC: 1.94 MIL/uL — ABNORMAL LOW (ref 4.22–5.81)
RDW: 18.6 % — ABNORMAL HIGH (ref 11.5–15.5)
WBC: 3.3 10*3/uL — ABNORMAL LOW (ref 4.0–10.5)
nRBC: 9.2 % — ABNORMAL HIGH (ref 0.0–0.2)

## 2018-11-08 LAB — DIC (DISSEMINATED INTRAVASCULAR COAGULATION)PANEL
D-Dimer, Quant: >20 ug/mL-FEU — ABNORMAL HIGH (ref 0.00–0.50)
Fibrinogen: 389 mg/dL (ref 210–475)
INR: 1.1 (ref 0.8–1.2)
Platelets: 45 10*3/uL — ABNORMAL LOW (ref 150–400)
Prothrombin Time: 14.5 seconds (ref 11.4–15.2)
Smear Review: NONE SEEN
aPTT: 40 seconds — ABNORMAL HIGH (ref 24–36)

## 2018-11-08 LAB — CBC
HCT: 22.2 % — ABNORMAL LOW (ref 39.0–52.0)
Hemoglobin: 7.5 g/dL — ABNORMAL LOW (ref 13.0–17.0)
MCH: 30.5 pg (ref 26.0–34.0)
MCHC: 33.8 g/dL (ref 30.0–36.0)
MCV: 90.2 fL (ref 80.0–100.0)
Platelets: 44 10*3/uL — ABNORMAL LOW (ref 150–400)
RBC: 2.46 MIL/uL — ABNORMAL LOW (ref 4.22–5.81)
RDW: 20 % — ABNORMAL HIGH (ref 11.5–15.5)
WBC: 2.4 10*3/uL — ABNORMAL LOW (ref 4.0–10.5)
nRBC: 9.2 % — ABNORMAL HIGH (ref 0.0–0.2)

## 2018-11-08 MED ORDER — LORAZEPAM 2 MG/ML PO CONC
0.5000 mg | ORAL | Status: DC | PRN
  Filled 2018-11-08: qty 0.25

## 2018-11-08 MED ORDER — POLYVINYL ALCOHOL 1.4 % OP SOLN
1.0000 [drp] | Freq: Four times a day (QID) | OPHTHALMIC | Status: DC | PRN
  Filled 2018-11-08: qty 15

## 2018-11-08 MED ORDER — LORAZEPAM 2 MG/ML IJ SOLN: 0.5000 mg | INTRAMUSCULAR | Status: DC | PRN

## 2018-11-08 MED ORDER — BIOTENE DRY MOUTH MT LIQD: 15.0000 mL | OROMUCOSAL | Status: DC | PRN

## 2018-11-08 MED ORDER — BISACODYL 10 MG RE SUPP: 10.0000 mg | Freq: Every day | RECTAL | Status: DC | PRN

## 2018-11-08 MED ORDER — HYDROMORPHONE BOLUS VIA INFUSION
1.0000 mg | INTRAVENOUS | Status: DC | PRN
  Administered 2018-11-09 (×3): 1 mg via INTRAVENOUS
  Administered 2018-11-10: 2 mg via INTRAVENOUS
  Administered 2018-11-10: 1 mg via INTRAVENOUS
  Administered 2018-11-11 (×3): 2 mg via INTRAVENOUS
  Administered 2018-11-11: 1 mg via INTRAVENOUS
  Filled 2018-11-08: qty 2

## 2018-11-08 MED ORDER — HYDROMORPHONE HCL 1 MG/ML IJ SOLN
1.0000 mg | Freq: Once | INTRAMUSCULAR | Status: AC
  Administered 2018-11-08: 15:00:00 1 mg via INTRAVENOUS
  Filled 2018-11-08: qty 1

## 2018-11-08 MED ORDER — SODIUM CHLORIDE 0.9 % IV SOLN
1.0000 mg/h | INTRAVENOUS | Status: DC
  Administered 2018-11-08: 1 mg/h via INTRAVENOUS
  Administered 2018-11-10: 2 mg/h via INTRAVENOUS
  Filled 2018-11-08 (×3): qty 5

## 2018-11-08 MED ORDER — HYDROMORPHONE HCL 1 MG/ML IJ SOLN
1.0000 mg | INTRAMUSCULAR | Status: AC | PRN
  Administered 2018-11-08 (×2): 2 mg via INTRAVENOUS
  Filled 2018-11-08 (×2): qty 2

## 2018-11-08 MED ORDER — LIP MEDEX EX OINT
TOPICAL_OINTMENT | CUTANEOUS | Status: AC
  Administered 2018-11-08: 15:00:00
  Filled 2018-11-08: qty 7

## 2018-11-08 MED ORDER — LORAZEPAM 0.5 MG PO TABS: 0.5000 mg | ORAL_TABLET | ORAL | Status: DC | PRN

## 2018-11-08 MED ORDER — ORAL CARE MOUTH RINSE: 15.0000 mL | OROMUCOSAL | Status: DC | PRN

## 2018-11-08 MED ORDER — GLYCOPYRROLATE 0.2 MG/ML IJ SOLN
0.2000 mg | INTRAMUSCULAR | Status: DC | PRN
  Filled 2018-11-08: qty 1

## 2018-11-08 NOTE — Progress Notes (Addendum)
PROGRESS NOTE  Bruce Lynch [id number] DOB: 23-Nov-1970 DOA: 11/07/2018 PCP: Donella Stade, PA-C   LOS: 0 days   Brief narrative: Bruce Lynch is a 48 y.o. male with medical history significant of metastatic sinonasal/parameningeal rhabdomyosarcoma follows up with Dr. Caleen Jobs, was on hospice until this morning was brought by his wife to [facility] long ED for abdominal discomfort and rectal bleeding.  Patient is a poor historian and he is in moderate distress from neck pain and most of the history was available from the patient's wife at bedside.  As per the wife, patient has been in pain has been clinically deteriorating over the last 2 weeks, with minimal oral intake and unable to urinate.  She reports hospice have not been able to help him relieve his pain so she brought the patient to the hospital.    On arrival to the ED, patient was afebrile tachycardic slightly hypertensive with blood pressure of 144 over 98.  Source significant for sodium of 130, potassium of 5.3, chloride of 88 bicarb of 19 BUN of 183, creatinine of 6.75, AST of 44 RBC of 6.4, WBC of 3.3, platelets of 10, COVID-19 screening test negative. CT of the abdomen and pelvis without contrast showed Asymmetric soft tissue density involving the lower right anterior chest wall and anterior fourth rib, with involvement of the anterior pleural surface, concerning for metastatic disease.  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 Moderate left hydroureteronephrosis without definite obstructing lesion. Given markedly distended bladder, findings are likely related to urinary retention. X-ray showed moderate right pleural effusion.   Assessment/Plan:  Active Problems:   Pancytopenia ([place])  Metastatic sinonasal/parameningeal rhabdomyosarcoma Currently patient is on home hospice but due to continued pain and difficulty controlling his symptoms patient has been  admitted for consideration of residential hospice.    Pancytopenia, rectal bleed.  Patient's wife at bedside.  No plan for blood transfusion or further work-up at this time.  Plan is comfort for him.   AKI with bilateral hydronephrosis.  Continue Foley catheter.  No further blood work-up.  Pancytopenia probably secondary to metastatic cancer/chemo.  Further blood work or GI intervention planned.  Transition to residential hospice when bed available.   Intractable pain, palliative care consulted.  Patient is on MS Contin at home home including oxycodone.  We will add Dilaudid IV 1 to 2 mg every 2 hours for adequate pain control.  DVT prophylaxis:  None.  Hospice  status.  Code Status: DNR  Family Communication: Discussed with wife at bedside and confirm the goals of care.   Disposition Plan:  Inpatient hospice.  Social worker consulted.  Will change to inpatient status at this time due to need for iv narcotics, assessment for residential hospice.   Consults: palliative care  Procedures:  None  Antibiotics: Anti-infectives (From admission, onward)   None     Subjective: Patient complains of severe neck pain, uncontrolled by current regimen.  Denies shortness of breath, nausea vomiting.  Patient has been having rectal bleeding.  Foley catheter with hematuria.  Objective: Vitals:   11/07/18 2123 11/08/18 0514  BP: (!) 142/96 (!) 132/94  Pulse: (!) 102 (!) 102  Resp: 16 20  Temp: 98 F (36.7 C) 98.6 F (37 C)  SpO2:  97%    Intake/Output Summary (Last 24 hours) at 11/08/2018 1138 Last data filed at 11/08/2018 1029 Gross per 24 hour  Intake 1740 ml  Output 4400 ml  Net -2660 ml  Filed Weights   11/07/18 1013  Weight: 62.6 kg   Body mass index is 18.72 kg/m.   Physical Exam: GENERAL: Patient is alert awake and communicative, in mild distress due to pain, thinly built. HENT: Pallor noted.  Pupils equally reactive to light. Oral mucosa is mildly dry. NECK: is  supple, no palpable thyroid enlargement. CHEST: Clear to auscultation. Diminished breath sounds bilaterally. CVS: S1 and S2 heard, Regular rate and rhythm. No pericardial rub. ABDOMEN: Soft, non-tender, bowel sounds are present. No palpable hepato-splenomegaly.  Foley catheter with hematuria EXTREMITIES: No edema. CNS: Cranial nerves are intact. No focal motor or sensory deficits. SKIN: warm and dry without rashes.  Data Review: I have personally reviewed the following laboratory data and studies,  CBC: Recent Labs  Lab 11/07/18 1021 11/08/18 0822 11/08/18 1040  WBC 3.3*  --  2.4*  NEUTROABS 2.3  --   --   HGB 6.4*  --  7.5*  HCT 19.4*  --  22.2*  MCV 100.0  --  90.2  PLT 10* 45* 44*   Basic Metabolic Panel: Recent Labs  Lab 11/07/18 1021  NA 130*  K 5.3*  CL 88*  CO2 19*  GLUCOSE 149*  BUN 183*  CREATININE 6.75*  CALCIUM 9.5   Liver Function Tests: Recent Labs  Lab 11/07/18 1021  AST 44*  ALT 11  ALKPHOS 63  BILITOT 0.4  PROT 7.7  ALBUMIN 3.8   Recent Labs  Lab 11/07/18 1021  LIPASE 35   No results for input(s): AMMONIA in the last 168 hours. Cardiac Enzymes: No results for input(s): CKTOTAL, CKMB, CKMBINDEX, TROPONINI in the last 168 hours. BNP (last 3 results) No results for input(s): BNP in the last 8760 hours.  ProBNP (last 3 results) No results for input(s): PROBNP in the last 8760 hours.  CBG: No results for input(s): GLUCAP in the last 168 hours. Recent Results (from the past 240 hour(s))  SARS Coronavirus 2 [facility] order, Performed in [facility] hospital lab) Nasopharyngeal Nasopharyngeal Swab     Status: None   Collection Time: 11/07/18  1:36 PM   Specimen: Nasopharyngeal Swab  Result Value Ref Range Status   SARS Coronavirus 2 NEGATIVE NEGATIVE Final    Comment: (NOTE) If result is NEGATIVE SARS-CoV-2 target nucleic acids are NOT DETECTED. The SARS-CoV-2 RNA is generally detectable in upper and lower  respiratory specimens during  the acute phase of infection. The lowest  concentration of SARS-CoV-2 viral copies this assay can detect is 250  copies / mL. A negative result does not preclude SARS-CoV-2 infection  and should not be used as the sole basis for treatment or other  patient management decisions.  A negative result may occur with  improper specimen collection / handling, submission of specimen other  than nasopharyngeal swab, presence of viral mutation(s) within the  areas targeted by this assay, and inadequate number of viral copies  (<250 copies / mL). A negative result must be combined with clinical  observations, patient history, and epidemiological information. If result is POSITIVE SARS-CoV-2 target nucleic acids are DETECTED. The SARS-CoV-2 RNA is generally detectable in upper and lower  respiratory specimens dur ing the acute phase of infection.  Positive  results are indicative of active infection with SARS-CoV-2.  Clinical  correlation with patient history and other diagnostic information is  necessary to determine patient infection status.  Positive results do  not rule out bacterial infection or co-infection with other viruses. If result is PRESUMPTIVE POSTIVE SARS-CoV-2 nucleic  acids MAY BE PRESENT.   A presumptive positive result was obtained on the submitted specimen  and confirmed on repeat testing.  While 2019 novel coronavirus  (SARS-CoV-2) nucleic acids may be present in the submitted sample  additional confirmatory testing may be necessary for epidemiological  and / or clinical management purposes  to differentiate between  SARS-CoV-2 and other Sarbecovirus currently known to infect humans.  If clinically indicated additional testing with an alternate test  methodology [phone]) is advised. The SARS-CoV-2 RNA is generally  detectable in upper and lower respiratory sp ecimens during the acute  phase of infection. The expected result is Negative. Fact Sheet for Patients:   StrictlyIdeas.no Fact Sheet for Healthcare Providers: BankingDealers.co.za This test is not yet approved or cleared by the [place] FDA and has been authorized for detection and/or diagnosis of SARS-CoV-2 by FDA under an Emergency Use Authorization (EUA).  This EUA will remain in effect (meaning this test can be used) for the duration of the COVID-19 declaration under Section 564(b)(1) of the Act, 21 U.S.C. section 360bbb-3(b)(1), unless the authorization is terminated or revoked sooner. Performed at [facility], [place] [address], [place], [place] [address]      Studies: Ct Abdomen Pelvis Wo Contrast  Result Date: 11/07/2018 CLINICAL DATA:  Rectal bleeding. 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 EXAM: CT ABDOMEN AND PELVIS WITHOUT CONTRAST TECHNIQUE: Multidetector CT imaging of the abdomen and pelvis was performed following the standard protocol without IV contrast. COMPARISON:  None. FINDINGS: Lower chest: Partially visualized small right anterior basal pneumothorax. Partially visualized moderate right pleural effusion with partial collapse of the right lower lobe. Asymmetric soft tissue density involving the lower right anterior chest wall adjacent to the anterior fourth rib, with cortical irregularity of the rib and involvement of the anterior pleural surface (series 2, image 2). Hepatobiliary: No focal liver abnormality. No gallbladder wall thickening, distention, or surrounding inflammatory changes. Pancreas: Unremarkable. No pancreatic ductal dilatation or surrounding inflammatory changes. Spleen: Atrophic.  No focal abnormality. Adrenals/Urinary Tract: Adrenal glands are unremarkable. Severe right hydronephrosis to the level of the UPJ with minimal residual right renal parenchyma. Moderate left hydroureteronephrosis without obstructing calculi. 4 mm nonobstructive calculus in the midpole of the left kidney. Markedly  distended bladder with a few diverticula. Stomach/Bowel: Stomach is within normal limits. Appendix is not identified. No evidence of bowel wall thickening, distention, or inflammatory changes. Moderately increased colonic stool burden. Vascular/Lymphatic: Aortic atherosclerosis. No enlarged abdominal or pelvic lymph nodes. Reproductive: Prostate is normal in size.  Possible TURP defect. Other: No abdominal wall hernia or abnormality. No abdominopelvic ascites. No pneumoperitoneum. Musculoskeletal: Patchy sclerosis throughout the visualized thoracolumbar spine which may be related to chemotherapy. 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 IMPRESSION: 1. Partially visualized small right anterior basal pneumothorax and moderate right pleural effusion. 2. Asymmetric soft tissue density involving the lower right anterior chest wall and anterior fourth rib, with involvement of the anterior pleural surface, concerning for metastatic disease. 3. 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 4. Moderate left hydroureteronephrosis without definite obstructing lesion. Given markedly distended bladder, findings are likely related to urinary retention. 5. Nonobstructive left nephrolithiasis. 6. Moderately increased colonic stool burden.  No obstruction. 7.  Aortic atherosclerosis (ICD10-I70.0). Electronically Signed   By: Titus Dubin M.D.   On: 11/07/2018 13:26   Dg Chest Port 1 View  Result Date: 11/07/2018 CLINICAL DATA:  Rectal bleeding. EXAM: PORTABLE CHEST  1 VIEW COMPARISON:  June 19, 2018 FINDINGS: There is a stable left-sided Port-A-Cath terminating in the duplicated SVC. There is a probable skin fold over the lateral upper chest no convincing evidence of pneumothorax. There is haziness over the right lower lung. There appears to be some pleural thickening laterally in the right upper chest. The cardiomediastinal  silhouette is stable. The left lung is clear. IMPRESSION: 1. Haziness over the right chest is new and could represent loculated fluid, an underlying pulmonary infiltrate, or something on the patient or in the chest wall. There is also some pleural thickening laterally in the right upper chest. A PA and lateral chest x-ray may better evaluate. Alternatively, a CT scan could further evaluate. 2. There is a skin fold over the upper right lateral chest but no convincing evidence of pneumothorax. 3. No other acute abnormalities. Electronically Signed   By: Dorise Bullion III M.D   On: 11/07/2018 14:02    Scheduled Meds:  feeding supplement (ENSURE ENLIVE)  237 mL Oral BID BM   mouth rinse  15 mL Mouth Rinse BID   senna-docusate  1 tablet Oral BID   sodium chloride flush  10-40 mL Intracatheter Q12H    Continuous Infusions:  sodium chloride 75 mL/hr at 11/07/18 2011     Flora Lipps, MD  [facility] 11/08/2018

## 2018-11-08 NOTE — Progress Notes (Signed)
Manufacturing engineer [facility]) Hospice  Referral received for residential hospice at [facility], from [place] mgr.    Unfortunately [facility] does not have a bed to offer today.    Called wife and left message for return call.  [facility] will reach out tomorrow to update about bed availability.  Venia Carbon RN, BSN, [place] [facility] Liaison (in [place]) [phone]

## 2018-11-08 NOTE — Progress Notes (Signed)
-  Patient seen and examined at bedside.  Continues to have rectal bleeding.  Patient complaining of generalized pain.  Discussed with primary doctor.  Patient and patient's wife has requested hospice.  GI will sign off.  Call us back if needed  Otis Brace MD, [place] 11/08/2018, 9:51 AM  Contact #  [phone]

## 2018-11-08 NOTE — TOC Initial Note (Signed)
Transition of Care [facility]) - Initial/Assessment Note    Patient Details  Name: Bruce Lynch MRN: [id number] Date of Birth: March 16, 1970  [facility]) CM/SW Contact:    Nila Nephew, LCSW Phone Number: [phone] 11/08/2018, 1:16 PM  Clinical Narrative:   Pt admitted with pancytopenia from home- has metastatic cancer and has hospice at home (CSW attempting to find out which agency, he and wife unsure name) Requesting residential hospice care due to increased burden of symptoms and desire for comfort care- requesting referral to [facility]. CSW referred and will follow.              Expected Discharge Plan: [place]     Patient Goals and CMS Choice Patient states their goals for this hospitalization and ongoing recovery are:: hospice care CMS Medicare.gov Compare Post Acute Care list provided to:: (pt's wife) Choice offered to / list presented to : Spouse  Expected Discharge Plan and Services Expected Discharge Plan: [place] In-house Referral: Clinical Social Work     Living arrangements for the past 2 months: Single Family Home Expected Discharge Date: 11/08/18                                    Prior Living Arrangements/Services Living arrangements for the past 2 months: Single Family Home Lives with:: Spouse Patient language and need for interpreter reviewed:: No Do you feel safe going back to the place where you live?: Yes      Need for Family Participation in Patient Care: Yes (Comment)(wife) Care giver support system in place?: Yes (comment)   Criminal Activity/Legal Involvement Pertinent to Current Situation/Hospitalization: No - Comment as needed  Activities of Daily Living Home Assistive Devices/Equipment: Walker (specify type) ADL Screening (condition at time of admission) Patient's cognitive ability adequate to safely complete daily activities?: No Is the patient deaf or have difficulty hearing?:  No Does the patient have difficulty seeing, even when wearing glasses/contacts?: No Does the patient have difficulty concentrating, remembering, or making decisions?: No Patient able to express need for assistance with ADLs?: Yes Does the patient have difficulty dressing or bathing?: Yes Independently performs ADLs?: No Communication: Independent Dressing (OT): Dependent Is this a change from baseline?: Pre-admission baseline Grooming: Dependent Is this a change from baseline?: Pre-admission baseline Feeding: Needs assistance Is this a change from baseline?: Pre-admission baseline Bathing: Dependent Is this a change from baseline?: Pre-admission baseline Toileting: Dependent Is this a change from baseline?: Pre-admission baseline In/Out Bed: Needs assistance Is this a change from baseline?: Pre-admission baseline Walks in Home: Needs assistance Is this a change from baseline?: Pre-admission baseline Does the patient have difficulty walking or climbing stairs?: Yes Weakness of Legs: Both Weakness of Arms/Hands: Both  Permission Sought/Granted Permission sought to share information with : Family Supports Permission granted to share information with : Yes, Verbal Permission Granted  Share Information with NAME: [phone] wife Bruce Lynch           Emotional Assessment Appearance:: Appears stated age Attitude/Demeanor/Rapport: Lethargic Affect (typically observed): Calm        Admission diagnosis:  Rectal bleeding [K62.5] Pancytopenia ([place]) [id number] Patient Active Problem List   Diagnosis Date Noted  . Intractable pain 11/08/2018  . Pancytopenia ([place]) 11/07/2018  . Rhabdomyosarcoma ([place]) 07/15/2018  . Gastroesophageal reflux disease 07/15/2018  . Bone metastases ([place]) 07/15/2018  . Malnutrition of moderate degree ([place]) 07/15/2018  . Atypical chest pain  07/15/2018  . Acute pain of right shoulder 07/15/2018  . Nasal polyposis 06/20/2017   PCP:  Donella Stade,  PA-C Pharmacy:   [id number] [address], [place] [address] Phone: [phone] Fax: [phone]     Social Determinants of Health (SDOH) Interventions    Readmission Risk Interventions No flowsheet data found.

## 2018-11-08 NOTE — Consult Note (Signed)
Consultation Note Date: 11/08/2018   Patient Name: Bruce Lynch  DOB: 05/12/1970  MRN: [id number]  Age / Sex: 48 y.o., male   PCP: Donella Stade, PA-C Referring Physician: Flora Lipps, MD   REASON FOR CONSULTATION:Establishing goals of care, Non pain symptom management, Pain control and Psychosocial/spiritual support  Palliative Care consult requested for this 48 y.o. male with multiple medical problems including metastatic sinonasal/parameningeal rhabdomyosarcoma (Dr. Angelina Ok @ [facility]), hypertension, and MI. He presented to ED from home with complaints of abdominal pain and rectal bleeding. He was on home hospice prior to admission, however wife feels symptoms were not controlled in the home setting despite hospice support. CT of abdomen and pelvis sowed asymmetric soft tissue density, severe right sided hydronephrosis, and moderate left hydroureteronephrosis without obstruction. Chest x-ray showed moderate right pleural effusion. Patient seen by GI and is not a candidate for EGD or colonoscopy due to severely low platelet count. He continues to have uncontrolled pain. Palliative Medicine team consulted for goals of care and symptom management.    Clinical Assessment and Goals of Care: I have reviewed medical records including lab results, imaging, Epic notes, and MAR, received report from the bedside RN, and assessed the patient. I met at the bedside with patient and his wife, Ernst Bowler to discuss diagnosis prognosis, [place], EOL wishes, disposition and options. Patient is awake, alert, in distress from pain. Observed grimacing, moaning, and moving around some in bed.   I re-introduced Palliative Medicine as specialized medical care for people living with serious illness. It focuses on providing relief from the symptoms and stress of a serious illness. The goal is to improve quality of life for both the patient and the family.  We discussed a brief life review of the patient, along  with his functional and nutritional status. 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 He has been married to his wife [name] for more than 21 years. They have 2 children. His son resided in [place] were patient is originally from. He and his sister Jehan Bonano are extremely close and like best friends. He enjoys spending time with family. He is of [place] faith.   Prior to admission patient was receiving hospice care (since July 2020) due to metastatic cancer. He was ambulatory in the home with a walker and standby assist until 2-3 days prior to admission. Wife reports at that time he began to complain of increased weakness and his "legs feeling like noodles" to the point he couldn't walk. Wife reports patient's appetite has drastically decreased over the past month with an unknown amount of weight loss. She feels he has loss at least 50lbs in the past 4-6 months. 2-3 weeks ago he began having complications with swallowing and choking at times, decreased appetite, and would only take in minimal amounts of liquid. He has become increasingly weaker and more lethargic. Wife reports the day before admission patient was lethargic with some altered mental.   We discussed His current illness and what it means in the larger context of His on-going co-morbidities. With specific discussions regarding his metastatic cancer, and overall functional and nutritional decline. Natural disease trajectory and expectations at EOL were discussed. Patient and wife both verbalized understanding of his health and continued decline. Support given. During discussion patient observed grimacing, moaning, and expressing discomfort.   Patient tearful stating "just do what needs to be done to get me out of pain. I don't want to keep suffering like this" support provided.  I attempted to elicit values and goals of care important to the patient.    The difference between aggressive medical intervention and comfort care was  considered in light of the patient's goals of care. Both patient and his wife verbalized the goal is for comfort and end-of-life care. I discussed pathways to assist with pain regimen including long-acting medications or a continuous drip. PCA considered however unsure if patient would be able to push button for pain relief. Wife verbalized understanding. Detailed discussion of full comfort, with anticipated hospital death vs. Residential hospice placement. Wife reports she is aware [facility] does not have a bed at this time. We discussed the process for [facility] transfer. Patient and wife verbalized wishes to initiate continuous drip for comfort and [employer].    Wife and patient confirmed DNR/DNI.   Goals are set and patient/wife verbalized understanding of the goals and philosophy of hospice care. They understand patient will remain hospitalized for symptom management and assessed for the ability to transfer to residential hospice once bed is available.   Questions and concerns were addressed. The family was encouraged to call with questions or concerns.  PMT will continue to support holistically.   SOCIAL HISTORY:     reports that he has been smoking cigarettes. He has a 8.75 pack-year smoking history. He has never used smokeless tobacco. He reports current alcohol use of about 1.0 standard drinks of alcohol per week. He reports that he does not use drugs.  CODE STATUS: DNR  ADVANCE DIRECTIVES: Ernst Bowler (wife/POA)   SYMPTOM MANAGEMENT: dilaudid drip for pain/air hunger, ativan anxiety/agitation, robinul prn for secretions  Palliative Prophylaxis:   Aspiration, Bowel Regimen, Delirium Protocol, Eye Care, Frequent Pain Assessment, Oral Care and Turn Reposition  PSYCHO-SOCIAL/SPIRITUAL:  Support System: Family   Desire for further Chaplaincy support: Yes   Additional Recommendations (Limitations, Scope, Preferences):  Full Comfort Care   PAST MEDICAL HISTORY: Past Medical  History:  Diagnosis Date  . Allergy   . Bone metastasis ([place])   . Cancer ([place])   . Heart attack ([place])   . Mass of nasal sinus   . Rhabdomyosarcoma ([place])   . Wears dentures     PAST SURGICAL HISTORY:  Past Surgical History:  Procedure Laterality Date  . EXCISION NASAL MASS Right 07/02/2017   Procedure: EXCISION NASAL MASS;  Surgeon: Izora Gala, MD;  Location: [facility];  Service: ENT;  Laterality: Right;  Right nasal endoscopy with biopsy of right nasal mass  . MULTIPLE TOOTH EXTRACTIONS    . TUMOR EXCISION     parathyroidectomy    ALLERGIES:  has No Known Allergies.   MEDICATIONS:  Current Facility-Administered Medications  Medication Dose Route Frequency Provider Last Rate Last Dose  . 0.9 %  sodium chloride infusion   Intravenous Continuous Pickenpack-Cousar, Bryer Gottsch N, NP 10 mL/hr at 11/08/18 1458    . antiseptic oral rinse (BIOTENE) solution 15 mL  15 mL Topical PRN Pickenpack-Cousar, Myrna Vonseggern N, NP      . bisacodyl (DULCOLAX) suppository 10 mg  10 mg Rectal Daily PRN Pickenpack-Cousar, Oceane Fosse N, NP      . gabapentin (NEURONTIN) capsule 300 mg  300 mg Oral Daily PRN Hosie Poisson, MD   300 mg at 11/08/18 1329  . glycopyrrolate (ROBINUL) injection 0.2 mg  0.2 mg Intravenous Q4H PRN Pickenpack-Cousar, Liev Brockbank N, NP      . HYDROmorphone (DILAUDID) 50 mg in sodium chloride 0.9 % 100 mL (0.5 mg/mL) infusion  1-4 mg/hr Intravenous Continuous Pickenpack-Cousar,  Chrystine Frogge N, NP      . HYDROmorphone (DILAUDID) bolus via infusion 1-2 mg  1-2 mg Intravenous Q1H PRN Pickenpack-Cousar, Verlie Liotta N, NP      . HYDROmorphone (DILAUDID) injection 1-2 mg  1-2 mg Intravenous Q2H PRN Pickenpack-Cousar, Rolla Servidio N, NP   2 mg at 11/08/18 1330  . LORazepam (ATIVAN) tablet 0.5 mg  0.5 mg Oral Q4H PRN Pickenpack-Cousar, Carlena Sax, NP       Or  . LORazepam (ATIVAN) 2 MG/ML concentrated solution 0.5 mg  0.5 mg Sublingual Q4H PRN Pickenpack-Cousar, Carlena Sax, NP       Or  . LORazepam (ATIVAN) injection 0.5 mg  0.5 mg  Intravenous Q4H PRN Pickenpack-Cousar, Susana Duell N, NP      . MEDLINE mouth rinse  15 mL Mouth Rinse PRN Pickenpack-Cousar, Dejha King N, NP      . ondansetron (ZOFRAN) injection 4 mg  4 mg Intravenous Q6H PRN Hosie Poisson, MD      . oxyCODONE (Oxy IR/ROXICODONE) immediate release tablet 5-10 mg  5-10 mg Oral Q4H PRN Hosie Poisson, MD   10 mg at 11/08/18 0738  . polyvinyl alcohol (LIQUIFILM TEARS) 1.4 % ophthalmic solution 1 drop  1 drop Both Eyes QID PRN Pickenpack-Cousar, Janalyn Higby N, NP      . senna-docusate (Senokot-S) tablet 1 tablet  1 tablet Oral BID Hosie Poisson, MD   1 tablet at 11/08/18 1011  . sodium chloride flush (NS) 0.9 % injection 10-40 mL  10-40 mL Intracatheter Q12H Hosie Poisson, MD   10 mL at 11/08/18 1013  . sodium chloride flush (NS) 0.9 % injection 10-40 mL  10-40 mL Intracatheter PRN Hosie Poisson, MD        VITAL SIGNS: BP (!) 132/94 (BP Location: Left Arm)   Pulse (!) 102   Temp 98.6 F (37 C) (Oral)   Resp 20   Ht 6' (1.829 m)   Wt 62.6 kg   SpO2 97%   BMI 18.72 kg/m  Filed Weights   11/07/18 1013  Weight: 62.6 kg    Estimated body mass index is 18.72 kg/m as calculated from the following:   Height as of this encounter: 6' (1.829 m).   Weight as of this encounter: 62.6 kg.  LABS: CBC:    Component Value Date/Time   WBC 2.4 (L) 11/08/2018 1040   HGB 7.5 (L) 11/08/2018 1040   HCT 22.2 (L) 11/08/2018 1040   PLT 44 (L) 11/08/2018 1040   Comprehensive Metabolic Panel:    Component Value Date/Time   NA 130 (L) 11/07/2018 1021   K 5.3 (H) 11/07/2018 1021   CO2 19 (L) 11/07/2018 1021   BUN 183 (H) 11/07/2018 1021   CREATININE 6.75 (H) 11/07/2018 1021   ALBUMIN 3.8 11/07/2018 1021     Review of Systems  Constitutional: Positive for appetite change.  Musculoskeletal: Positive for arthralgias and back pain.  Neurological: Positive for weakness.  All other systems reviewed and are negative.  Physical Exam General: patient appears uncomfortable in pain,  grimacing, moaning, frail chronically-ill appearing, cachectic Cardiovascular: regular rate and rhythm Pulmonary: diminished bilaterally  Abdomen: soft, nontender, + bowel sounds Extremities: no edema, no joint deformities Skin: no rashes, warm, dry, oral mucosa dry, cracked lips Neurological: weakness, awake, alert, able to communicate appropriately   Prognosis: < 2 weeks (Days) in the setting of metastatic sinonasal/parameningeal rhabdomyosarcoma, AKI wit bilateral hydronephrosis, pancytopenia, generalized weakness, severe protein calorie malnutrition, cachectic, weight jloss >10%, intractable pain, full comfort care.   Discharge Planning:  Anticipated Hospital Death vs [facility]  Recommendations:  DNR/DNI-as confirmed by patient/wife  Full Comfort Care   Patient and wife educated on full comfort care. Patient expressed goals is for comfort and better pain relief with awareness and anticipation of hospital death vs. Transfer to residential hospice [facility]).   Will d/c orders not comfort focused   Dilaudid drip/boluses with ability to titrate for comfort for pain/air hunger  Ativan PRN for anxiety/agitation  Robinul PRN for excessive secretions  Zofran PRN for nausea  Oral care PRN   Liquifilm PRN for dry eyes  Patient may have comfort foods  Comfort cart for family  PMT will continue to support and follow    Palliative Performance Scale: PPS 20% (occassional intake of small amounts of liquids). Full Comfort                Patient and wife expressed understanding and was in agreement with this plan.   Thank you for allowing the Palliative Medicine Team to assist in the care of this patient.  Time In: 1345 Time Out: 1450 Time Total: 65 min.   Visit consisted of counseling and education dealing with the complex and emotionally intense issues of symptom management and palliative care in the setting of serious and potentially life-threatening illness.Greater than  50%  of this time was spent counseling and coordinating care related to the above assessment and plan.  Signed by:  Alda Lea, AGPCNP-BC Palliative Medicine Team  Phone: [phone] Fax: [phone] Pager: [phone] Amion: Bjorn Pippin

## 2018-11-08 NOTE — Progress Notes (Signed)
Nutrition Brief Note  Chart reviewed. Pt admitted due to poor pain control at home on hospice.  Plan to continue comfort care with residential hospice placement.   Continue current nutrition interventions.  Please re-consult as needed.   [place], [place], [place] Pager [phone] After Hours Pager

## 2018-11-09 DIAGNOSIS — C799 Secondary malignant neoplasm of unspecified site: Secondary | ICD-10-CM

## 2018-11-09 LAB — TYPE AND SCREEN
ABO/RH(D): A POS
Antibody Screen: NEGATIVE
Unit division: 0
Unit division: 0

## 2018-11-09 LAB — BPAM PLATELET PHERESIS
Blood Product Expiration Date: 202008302359
ISSUE DATE / TIME: 202008291953
Unit Type and Rh: 6200

## 2018-11-09 LAB — PREPARE PLATELET PHERESIS: Unit division: 0

## 2018-11-09 LAB — BPAM RBC
Blood Product Expiration Date: 202009222359
Blood Product Expiration Date: 202009222359
ISSUE DATE / TIME: 202008291314
ISSUE DATE / TIME: 202008291616
Unit Type and Rh: 6200
Unit Type and Rh: 6200

## 2018-11-09 LAB — HIV ANTIBODY (ROUTINE TESTING W REFLEX): HIV Screen 4th Generation wRfx: NONREACTIVE

## 2018-11-09 MED ORDER — DIPHENHYDRAMINE HCL 50 MG/ML IJ SOLN
25.0000 mg | Freq: Four times a day (QID) | INTRAMUSCULAR | Status: DC | PRN
  Administered 2018-11-09: 21:00:00 25 mg via INTRAVENOUS

## 2018-11-09 NOTE — Progress Notes (Signed)
Daily Progress Note   Patient Name: Bruce Lynch       Date: 11/09/2018 DOB: [birth year]  Age: 48 y.o. MRN#: [id number] Attending Physician: Flora Lipps, MD Primary Care Physician: Lavada Mesi Admit Date: 11/07/2018  Reason for Consultation/Follow-up: Establishing goals of care, Non pain symptom management and Pain control  Subjective: Patient resting in bed. Occasional moans. He easily awakens to name. Reports lower back pain but much more controlled today compared to yesterday. He gives a thumbs up. States he does have occasional episodes that pain will increase. Po intake remains poor. Wife reporting he will take a few sips of water to wet mouth. Patient states he feels anxious at times. If he hears a noise or when he feels overstimulated. Discussed use of ativan as needed.   Wife and sister is at the bedside. Family verbalized appreciation of care being given. Updated wife on patient's bed status and they are aware he remains on waiting list for [facility]. Wife requesting DNR form. This has been given to her.   All questions answered.   Length of Stay: 1  Current Medications: Scheduled Meds:  . senna-docusate  1 tablet Oral BID  . sodium chloride flush  10-40 mL Intracatheter Q12H    Continuous Infusions: . sodium chloride 10 mL/hr at 11/08/18 1458  . HYDROmorphone 1 mg/hr (11/08/18 1615)    PRN Meds: antiseptic oral rinse, bisacodyl, gabapentin, glycopyrrolate, HYDROmorphone, LORazepam **OR** LORazepam **OR** LORazepam, mouth rinse, ondansetron (ZOFRAN) IV, oxyCODONE, polyvinyl alcohol, sodium chloride flush  Physical Exam     Sleeping, easily aroused, chronically-ill appearing, NAD, cachectic Diminished bilaterally, RRR Skin warm, dry, intact       Vital  Signs: BP 128/87 (BP Location: Right Arm)   Pulse (!) 103   Temp 98 F (36.7 C) (Oral)   Resp 16   Ht 6' (1.829 m)   Wt 62.6 kg   SpO2 96%   BMI 18.72 kg/m  SpO2: SpO2: 96 % O2 Device: O2 Device: Room Air O2 Flow Rate:    Intake/output summary:   Intake/Output Summary (Last 24 hours) at 11/09/2018 1507 Last data filed at 11/09/2018 1300 Gross per 24 hour  Intake 747.98 ml  Output 1953 ml  Net -1205.02 ml   LBM: Last BM Date: (wife states its been a month) Baseline Weight:  Weight: 62.6 kg Most recent weight: Weight: 62.6 kg       Palliative Assessment/Data: FULL COMFORT     Flowsheet Rows     Most Recent Value  Intake Tab  Referral Department  Hospitalist  Unit at Time of Referral  Med/Surg Unit  Palliative Care Primary Diagnosis  Cancer [Uncontrolled pain]  Date Notified  11/07/18  Palliative Care Type  New Palliative care  Reason for referral  Pain, Counsel Regarding Hospice, Non-pain Symptom, End of Life Care Assistance, Clarify Goals of Care  Date of Admission  11/07/18  Date first seen by Palliative Care  11/08/18  # of days Palliative referral response time  1 Day(s)  # of days IP prior to Palliative referral  0  Clinical Assessment  Palliative Performance Scale Score  20%  Pain Max last 24 hours  10  Pain Min Last 24 hours  8  Dyspnea Max Last 24 Hours  0  Dyspnea Min Last 24 hours  0  Nausea Max Last 24 Hours  5  Nausea Min Last 24 Hours  0  Anxiety Max Last 24 Hours  5  Anxiety Min Last 24 Hours  0  Psychosocial & Spiritual Assessment  Social Work Plan of Care  -- [end-of-life, [place] Outcomes  Patient/Family meeting held?  Yes  Who was at the meeting?  [name] (wife) and the patient  Palliative Care Outcomes  Improved pain interventions, Clarified goals of care, Counseled regarding hospice, Provided psychosocial or spiritual support, Changed to focus on comfort, Transitioned to hospice  Other Treatment Preference Instructions   anxiety, pain, comfort at EOL      Patient Active Problem List   Diagnosis Date Noted  . Intractable pain 11/08/2018  . Pancytopenia ([place]) 11/07/2018  . Rhabdomyosarcoma ([place]) 07/15/2018  . Gastroesophageal reflux disease 07/15/2018  . Bone metastases ([place]) 07/15/2018  . Malnutrition of moderate degree ([place]) 07/15/2018  . Atypical chest pain 07/15/2018  . Acute pain of right shoulder 07/15/2018  . Nasal polyposis 06/20/2017    Palliative Care Assessment & Plan   Patient Profile: Palliative Care consult requested for this 48 y.o. male with multiple medical problems including metastatic sinonasal/parameningeal rhabdomyosarcoma (Dr. Angelina Ok @ [facility]), hypertension, and MI. He presented to ED from home with complaints of abdominal pain and rectal bleeding. He was on home hospice prior to admission, however wife feels symptoms were not controlled in the home setting despite hospice support. CT of abdomen and pelvis sowed asymmetric soft tissue density, severe right sided hydronephrosis, and moderate left hydroureteronephrosis without obstruction. Chest x-ray showed moderate right pleural effusion. Patient seen by GI and is not a candidate for EGD or colonoscopy due to severely low platelet count. He continues to have uncontrolled pain. Palliative Medicine team consulted for goals of care and symptom management.    Recommendations/Plan:  DNR/DNI  FULL COMFORT CARE-pending [facility] bed availability  PMT will continue to support and follow as needed.  Goals of Care and Additional Recommendations:  Limitations on Scope of Treatment: Full Scope Treatment  Code Status:    Code Status Orders  (From admission, onward)         Start     Ordered   11/08/18 1444  Do not attempt resuscitation (DNR)  Continuous    Question Answer Comment  In the event of cardiac or respiratory ARREST Do not call a "code blue"   In the event of cardiac or respiratory ARREST Do not perform Intubation,  CPR, defibrillation  or ACLS   In the event of cardiac or respiratory ARREST Use medication by any route, position, wound care, and other measures to relive pain and suffering. May use oxygen, suction and manual treatment of airway obstruction as needed for comfort.      11/08/18 1504        Code Status History    Date Active Date Inactive Code Status Order ID Comments User Context   11/07/2018 1701 11/08/2018 1504 DNR [id number]  Hosie Poisson, MD ED   Advance Care Planning Activity    Advance Directive Documentation     Most Recent Value  Type of Advance Directive  Out of facility DNR (pink MOST or yellow form)  Pre-existing out of facility DNR order (yellow form or pink MOST form)  Yellow form placed in chart (order not valid for inpatient use)  "MOST" Form in Place?  -     Prognosis:   < 2 weeks-in the setting of full comfort, poor po intake, and metastatic sinonasal/parameningeal rhabdomyosarcoma   Discharge Planning:  Hospice facility vs hospital death (pending bed availability)  Care plan was discussed with patient's wife and sister.   Thank you for allowing the Palliative Medicine Team to assist in the care of this patient.  Total Time: 25 min  Greater than 50%  of this time was spent counseling and coordinating care related to the above assessment and plan.  Alda Lea, AGPCNP-BC Palliative Medicine Team    Please contact Palliative Medicine Team phone at [phone] for questions and concerns.

## 2018-11-09 NOTE — Progress Notes (Signed)
When I arrived, pt's room was dark.  Pt's wife, Bruce Lynch, and daughter were bedside. They said they were doing okay. Daughter said she was "holding up".  They said they did not have any needs at this time but appreciated the visit. Please page if anything changes or if additional support is needed. [place], [place]   11/09/18 2000  Clinical Encounter Type  Visited With Family

## 2018-11-09 NOTE — Progress Notes (Signed)
PROGRESS NOTE  Bruce Lynch [id number] DOB: [birth year] DOA: 11/07/2018 PCP: Donella Stade, PA-C   LOS: 1 day   Brief narrative: Bruce Lynch is a 48 y.o. male with medical history significant of metastatic sinonasal/parameningeal rhabdomyosarcoma follows up with Dr. Caleen Jobs, was on hospice until this morning was brought by his wife to [facility] long ED for abdominal discomfort and rectal bleeding.  Patient is a poor historian and he is in moderate distress from neck pain and most of the history was available from the patient's wife at bedside.  As per the wife, patient has been in pain has been clinically deteriorating over the last 2 weeks, with minimal oral intake and unable to urinate.  She reports hospice have not been able to help him relieve his pain so she brought the patient to the hospital.    On arrival to the ED, patient was afebrile tachycardic slightly hypertensive with blood pressure of 144 over 98.  Source significant for sodium of 130, potassium of 5.3, chloride of 88 bicarb of 19 BUN of 183, creatinine of 6.75, AST of 44 RBC of 6.4, WBC of 3.3, platelets of 10, COVID-19 screening test negative. CT of the abdomen and pelvis without contrast showed Asymmetric soft tissue density involving the lower right anterior chest wall and anterior fourth rib, with involvement of the anterior pleural surface, concerning for metastatic disease.  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 Moderate left hydroureteronephrosis without definite obstructing lesion. Given markedly distended bladder, findings are likely related to urinary retention. X-ray showed moderate right pleural effusion.   Assessment/Plan:  Active Problems:   Pancytopenia (HCC)   Intractable pain  Metastatic sinonasal/parameningeal rhabdomyosarcoma. Currently patient is on home hospice but due to continued pain and difficulty controlling his  symptoms, patient has been admitted for consideration of residential hospice.  Currently awaiting placement  Pancytopenia, rectal bleed.  Patient's wife at bedside.  No plan for blood transfusion or further work-up at this time.  Continue to provide supportive care.  AKI with bilateral hydronephrosis.  Continue Foley catheter.  No further blood work-up.  Pancytopenia probably secondary to metastatic cancer/chemo.  Further blood work or GI intervention planned.  Transition to residential hospice when bed available.   Intractable pain, palliative care consulted.  Patient is on MS Contin at home home including oxycodone.  Also on Dilaudid IV 1 to 2 mg every 2 hours for adequate pain control.  DVT prophylaxis:  None.  Hospice  status.  Code Status: DNR  Family Communication: Discussed with wife at bedside .  Disposition Plan:  Inpatient hospice.  [facility]  Consults: palliative care  Procedures:  None  Antibiotics: Anti-infectives (From admission, onward)   None     Subjective: Patient feels a little better with his pain today.  Denies shortness of breath, fevers chills nausea or vomiting.  Objective: Vitals:   11/08/18 0514 11/08/18 1526  BP: (!) 132/94 131/76  Pulse: (!) 102 96  Resp: 20 10  Temp: 98.6 F (37 C) 97.8 F (36.6 C)  SpO2: 97% 97%    Intake/Output Summary (Last 24 hours) at 11/09/2018 1319 Last data filed at 11/09/2018 0900 Gross per 24 hour  Intake 2131.44 ml  Output 1002 ml  Net 1129.44 ml   Filed Weights   11/07/18 1013  Weight: 62.6 kg   Body mass index is 18.72 kg/m.   Physical Exam: GENERAL: Patient is alert awake and communicative, thinly built. HENT:  Pallor noted.  Pupils equally reactive to light. Oral mucosa is mildly dry. NECK: is supple, no palpable thyroid enlargement. CHEST: Clear to auscultation. Diminished breath sounds bilaterally. CVS: S1 and S2 heard, Regular rate and rhythm. No pericardial  rub. ABDOMEN: Soft, non-tender, bowel sounds are present. No palpable hepato-splenomegaly.  Foley catheter with hematuria EXTREMITIES: No edema. CNS: Cranial nerves are intact. No focal motor or sensory deficits. SKIN: warm and dry without rashes.  Data Review: I have personally reviewed the following laboratory data and studies,  CBC: Recent Labs  Lab 11/07/18 1021 11/08/18 0822 11/08/18 1040  WBC 3.3*  --  2.4*  NEUTROABS 2.3  --   --   HGB 6.4*  --  7.5*  HCT 19.4*  --  22.2*  MCV 100.0  --  90.2  PLT 10* 45* 44*   Basic Metabolic Panel: Recent Labs  Lab 11/07/18 1021  NA 130*  K 5.3*  CL 88*  CO2 19*  GLUCOSE 149*  BUN 183*  CREATININE 6.75*  CALCIUM 9.5   Liver Function Tests: Recent Labs  Lab 11/07/18 1021  AST 44*  ALT 11  ALKPHOS 63  BILITOT 0.4  PROT 7.7  ALBUMIN 3.8   Recent Labs  Lab 11/07/18 1021  LIPASE 35   No results for input(s): AMMONIA in the last 168 hours. Cardiac Enzymes: No results for input(s): CKTOTAL, CKMB, CKMBINDEX, TROPONINI in the last 168 hours. BNP (last 3 results) No results for input(s): BNP in the last 8760 hours.  ProBNP (last 3 results) No results for input(s): PROBNP in the last 8760 hours.  CBG: No results for input(s): GLUCAP in the last 168 hours. Recent Results (from the past 240 hour(s))  SARS Coronavirus 2 [facility] order, Performed in [facility] hospital lab) Nasopharyngeal Nasopharyngeal Swab     Status: None   Collection Time: 11/07/18  1:36 PM   Specimen: Nasopharyngeal Swab  Result Value Ref Range Status   SARS Coronavirus 2 NEGATIVE NEGATIVE Final    Comment: (NOTE) If result is NEGATIVE SARS-CoV-2 target nucleic acids are NOT DETECTED. The SARS-CoV-2 RNA is generally detectable in upper and lower  respiratory specimens during the acute phase of infection. The lowest  concentration of SARS-CoV-2 viral copies this assay can detect is 250  copies / mL. A negative result does not preclude  SARS-CoV-2 infection  and should not be used as the sole basis for treatment or other  patient management decisions.  A negative result may occur with  improper specimen collection / handling, submission of specimen other  than nasopharyngeal swab, presence of viral mutation(s) within the  areas targeted by this assay, and inadequate number of viral copies  (<250 copies / mL). A negative result must be combined with clinical  observations, patient history, and epidemiological information. If result is POSITIVE SARS-CoV-2 target nucleic acids are DETECTED. The SARS-CoV-2 RNA is generally detectable in upper and lower  respiratory specimens dur ing the acute phase of infection.  Positive  results are indicative of active infection with SARS-CoV-2.  Clinical  correlation with patient history and other diagnostic information is  necessary to determine patient infection status.  Positive results do  not rule out bacterial infection or co-infection with other viruses. If result is PRESUMPTIVE POSTIVE SARS-CoV-2 nucleic acids MAY BE PRESENT.   A presumptive positive result was obtained on the submitted specimen  and confirmed on repeat testing.  While 2019 novel coronavirus  (SARS-CoV-2) nucleic acids may be present in the submitted sample  additional confirmatory testing may be necessary for epidemiological  and / or clinical management purposes  to differentiate between  SARS-CoV-2 and other Sarbecovirus currently known to infect humans.  If clinically indicated additional testing with an alternate test  methodology [phone]) is advised. The SARS-CoV-2 RNA is generally  detectable in upper and lower respiratory sp ecimens during the acute  phase of infection. The expected result is Negative. Fact Sheet for Patients:  StrictlyIdeas.no Fact Sheet for Healthcare Providers: BankingDealers.co.za This test is not yet approved or cleared by the  [place] FDA and has been authorized for detection and/or diagnosis of SARS-CoV-2 by FDA under an Emergency Use Authorization (EUA).  This EUA will remain in effect (meaning this test can be used) for the duration of the COVID-19 declaration under Section 564(b)(1) of the Act, 21 U.S.C. section 360bbb-3(b)(1), unless the authorization is terminated or revoked sooner. Performed at [facility], [place] [address] [place], [place] [address]      Studies: Dg Chest Port 1 View  Result Date: 11/07/2018 CLINICAL DATA:  Rectal bleeding. EXAM: PORTABLE CHEST 1 VIEW COMPARISON:  June 19, 2018 FINDINGS: There is a stable left-sided Port-A-Cath terminating in the duplicated SVC. There is a probable skin fold over the lateral upper chest no convincing evidence of pneumothorax. There is haziness over the right lower lung. There appears to be some pleural thickening laterally in the right upper chest. The cardiomediastinal silhouette is stable. The left lung is clear. IMPRESSION: 1. Haziness over the right chest is new and could represent loculated fluid, an underlying pulmonary infiltrate, or something on the patient or in the chest wall. There is also some pleural thickening laterally in the right upper chest. A PA and lateral chest x-ray may better evaluate. Alternatively, a CT scan could further evaluate. 2. There is a skin fold over the upper right lateral chest but no convincing evidence of pneumothorax. 3. No other acute abnormalities. Electronically Signed   By: Dorise Bullion III M.D   On: 11/07/2018 14:02    Scheduled Meds:  senna-docusate  1 tablet Oral BID   sodium chloride flush  10-40 mL Intracatheter Q12H    Continuous Infusions:  sodium chloride 10 mL/hr at 11/08/18 1458   HYDROmorphone 1 mg/hr (11/08/18 1615)     Flora Lipps, MD  [facility] 11/09/2018

## 2018-11-10 NOTE — Progress Notes (Addendum)
NT has made several attempts to give bath and perform foley care/ peri care and patient has refused stating he will allow at a later time.

## 2018-11-10 NOTE — Progress Notes (Signed)
PROGRESS NOTE  Bruce Lynch [id number] DOB: 04-Jul-1970 DOA: 11/07/2018 PCP: Donella Stade, PA-C   LOS: 2 days   Brief narrative: Bruce Lynch is a 48 y.o. male with medical history significant of metastatic sinonasal/parameningeal rhabdomyosarcoma follows up with Dr. Caleen Jobs, was on hospice until this morning was brought by his wife to [facility] long ED for abdominal discomfort and rectal bleeding.  Patient is a poor historian and he is in moderate distress from neck pain and most of the history was available from the patient's wife at bedside.  As per the wife, patient has been in pain has been clinically deteriorating over the last 2 weeks, with minimal oral intake and unable to urinate.  She reports hospice have not been able to help him relieve his pain so she brought the patient to the hospital.    On arrival to the ED, patient was afebrile tachycardic slightly hypertensive with blood pressure of 144 over 98.  Source significant for sodium of 130, potassium of 5.3, chloride of 88 bicarb of 19 BUN of 183, creatinine of 6.75, AST of 44 RBC of 6.4, WBC of 3.3, platelets of 10, COVID-19 screening test negative. CT of the abdomen and pelvis without contrast showed Asymmetric soft tissue density involving the lower right anterior chest wall and anterior fourth rib, with involvement of the anterior pleural surface, concerning for metastatic disease.  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 Moderate left hydroureteronephrosis without definite obstructing lesion. Given markedly distended bladder, findings are likely related to urinary retention. X-ray showed moderate right pleural effusion.   Assessment/Plan:  Active Problems:   Pancytopenia (HCC)   Intractable pain  Metastatic sinonasal/parameningeal rhabdomyosarcoma. Currently patient is on home hospice but due to continued pain and difficulty controlling his  symptoms, patient has been admitted for consideration of residential hospice.  [facility]  Pancytopenia, rectal bleed.  Patient's wife at bedside.  No plan for blood transfusion or further work-up at this time.  Continue to provide supportive care.  AKI with bilateral hydronephrosis.  Continue Foley catheter.  No further blood work-up.  Pancytopenia probably secondary to metastatic cancer/chemo.  Further blood work or GI intervention planned.  Transition to residential hospice when bed available.   Intractable pain, palliative care consulted.  Patient is on MS Contin at home home including oxycodone.  Also on Dilaudid IV 1 to 2 mg every 2 hours for adequate pain control.  DVT prophylaxis:  None.  Hospice  status.  Code Status: DNR  Family Communication:  None  Disposition Plan:  Inpatient hospice.  Stable for disposition.  [facility]  Consults: palliative care  Procedures:  None  Antibiotics: Anti-infectives (From admission, onward)   None     Subjective: Patient states that his pain is overall better.  Denies any increasing shortness of breath, fever chills nausea vomiting.  Objective: Vitals:   11/08/18 1526 11/09/18 1337  BP: 131/76 128/87  Pulse: 96 (!) 103  Resp: 10 16  Temp: 97.8 F (36.6 C) 98 F (36.7 C)  SpO2: 97% 96%    Intake/Output Summary (Last 24 hours) at 11/10/2018 1507 Last data filed at 11/10/2018 1130 Gross per 24 hour  Intake 960 ml  Output 1901 ml  Net -941 ml   Filed Weights   11/07/18 1013  Weight: 62.6 kg   Body mass index is 18.72 kg/m.   Physical Exam: GENERAL: Patient is alert awake and communicative,  thinly built. HENT: Pallor noted.  Pupils equally reactive to light. Oral mucosa is mildly dry. NECK: is supple, no palpable thyroid enlargement. CHEST: Clear to auscultation. Diminished breath sounds bilaterally. CVS: S1 and S2 heard, Regular rate and rhythm. No  pericardial rub. ABDOMEN: Soft, non-tender, bowel sounds are present. No palpable hepato-splenomegaly.  Foley catheter with hematuria EXTREMITIES: No edema. CNS: Cranial nerves are intact. No focal motor or sensory deficits. SKIN: warm and dry without rashes.  Data Review: I have personally reviewed the following laboratory data and studies,  CBC: Recent Labs  Lab 11/07/18 1021 11/08/18 0822 11/08/18 1040  WBC 3.3*  --  2.4*  NEUTROABS 2.3  --   --   HGB 6.4*  --  7.5*  HCT 19.4*  --  22.2*  MCV 100.0  --  90.2  PLT 10* 45* 44*   Basic Metabolic Panel: Recent Labs  Lab 11/07/18 1021  NA 130*  K 5.3*  CL 88*  CO2 19*  GLUCOSE 149*  BUN 183*  CREATININE 6.75*  CALCIUM 9.5   Liver Function Tests: Recent Labs  Lab 11/07/18 1021  AST 44*  ALT 11  ALKPHOS 63  BILITOT 0.4  PROT 7.7  ALBUMIN 3.8   Recent Labs  Lab 11/07/18 1021  LIPASE 35   No results for input(s): AMMONIA in the last 168 hours. Cardiac Enzymes: No results for input(s): CKTOTAL, CKMB, CKMBINDEX, TROPONINI in the last 168 hours. BNP (last 3 results) No results for input(s): BNP in the last 8760 hours.  ProBNP (last 3 results) No results for input(s): PROBNP in the last 8760 hours.  CBG: No results for input(s): GLUCAP in the last 168 hours. Recent Results (from the past 240 hour(s))  SARS Coronavirus 2 [facility] order, Performed in [facility] hospital lab) Nasopharyngeal Nasopharyngeal Swab     Status: None   Collection Time: 11/07/18  1:36 PM   Specimen: Nasopharyngeal Swab  Result Value Ref Range Status   SARS Coronavirus 2 NEGATIVE NEGATIVE Final    Comment: (NOTE) If result is NEGATIVE SARS-CoV-2 target nucleic acids are NOT DETECTED. The SARS-CoV-2 RNA is generally detectable in upper and lower  respiratory specimens during the acute phase of infection. The lowest  concentration of SARS-CoV-2 viral copies this assay can detect is 250  copies / mL. A negative result does not  preclude SARS-CoV-2 infection  and should not be used as the sole basis for treatment or other  patient management decisions.  A negative result may occur with  improper specimen collection / handling, submission of specimen other  than nasopharyngeal swab, presence of viral mutation(s) within the  areas targeted by this assay, and inadequate number of viral copies  (<250 copies / mL). A negative result must be combined with clinical  observations, patient history, and epidemiological information. If result is POSITIVE SARS-CoV-2 target nucleic acids are DETECTED. The SARS-CoV-2 RNA is generally detectable in upper and lower  respiratory specimens dur ing the acute phase of infection.  Positive  results are indicative of active infection with SARS-CoV-2.  Clinical  correlation with patient history and other diagnostic information is  necessary to determine patient infection status.  Positive results do  not rule out bacterial infection or co-infection with other viruses. If result is PRESUMPTIVE POSTIVE SARS-CoV-2 nucleic acids MAY BE PRESENT.   A presumptive positive result was obtained on the submitted specimen  and confirmed on repeat testing.  While 2019 novel coronavirus  (SARS-CoV-2) nucleic acids may be present in  the submitted sample  additional confirmatory testing may be necessary for epidemiological  and / or clinical management purposes  to differentiate between  SARS-CoV-2 and other Sarbecovirus currently known to infect humans.  If clinically indicated additional testing with an alternate test  methodology [phone]) is advised. The SARS-CoV-2 RNA is generally  detectable in upper and lower respiratory sp ecimens during the acute  phase of infection. The expected result is Negative. Fact Sheet for Patients:  StrictlyIdeas.no Fact Sheet for Healthcare Providers: BankingDealers.co.za This test is not yet approved or cleared by  the [place] FDA and has been authorized for detection and/or diagnosis of SARS-CoV-2 by FDA under an Emergency Use Authorization (EUA).  This EUA will remain in effect (meaning this test can be used) for the duration of the COVID-19 declaration under Section 564(b)(1) of the Act, 21 U.S.C. section 360bbb-3(b)(1), unless the authorization is terminated or revoked sooner. Performed at [facility], [place] [address], [place], [place] [address]      Studies: No results found.  Scheduled Meds: . senna-docusate  1 tablet Oral BID  . sodium chloride flush  10-40 mL Intracatheter Q12H    Continuous Infusions: . sodium chloride 10 mL/hr at 11/08/18 1458  . HYDROmorphone 2 mg/hr (11/10/18 0046)     Flora Lipps, MD  [facility] 11/10/2018

## 2018-11-11 DIAGNOSIS — Z7189 Other specified counseling: Secondary | ICD-10-CM

## 2018-11-11 DIAGNOSIS — Z515 Encounter for palliative care: Secondary | ICD-10-CM

## 2018-11-11 DIAGNOSIS — K625 Hemorrhage of anus and rectum: Secondary | ICD-10-CM

## 2018-11-11 MED ORDER — SENNOSIDES-DOCUSATE SODIUM 8.6-50 MG PO TABS: 1.0000 | ORAL_TABLET | Freq: Two times a day (BID) | ORAL | Status: AC

## 2018-11-11 MED ORDER — BIOTENE DRY MOUTH MT LIQD: 15.0000 mL | OROMUCOSAL | Status: AC | PRN

## 2018-11-11 MED ORDER — BISACODYL 10 MG RE SUPP: 10.0000 mg | Freq: Every day | RECTAL | 0 refills | Status: AC | PRN

## 2018-11-11 NOTE — Progress Notes (Signed)
Daily Progress Note   Patient Name: Bruce Lynch       Date: 11/11/2018 DOB: 1970/05/27  Age: 48 y.o. MRN#: [id number] Attending Physician: Flora Lipps, MD Primary Care Physician: Lavada Mesi Admit Date: 11/07/2018  Reason for Consultation/Follow-up: Terminal Care  Subjective:  patient is resting in bed, appears weak, cachectic. Patient has minimal, almost nil PO intake, for several days now. He is requiring Dilaudid drip continuously. Wife now at bedside. She wished to discuss further about current scope of comfort measures and disposition plan of residential hospice. See below.   Length of Stay: 3  Current Medications: Scheduled Meds:  . senna-docusate  1 tablet Oral BID  . sodium chloride flush  10-40 mL Intracatheter Q12H    Continuous Infusions: . sodium chloride 10 mL/hr at 11/08/18 1458  . HYDROmorphone 2 mg/hr (11/10/18 0046)    PRN Meds: antiseptic oral rinse, bisacodyl, diphenhydrAMINE, gabapentin, glycopyrrolate, HYDROmorphone, LORazepam **OR** LORazepam **OR** LORazepam, mouth rinse, ondansetron (ZOFRAN) IV, oxyCODONE, polyvinyl alcohol, sodium chloride flush  Physical Exam         Frail gentleman Only taking sips of water, no PO intake Shallow clear breath sounds S1 S2 Abdomen soft Has muscle wasting No edema Is able to respond some  Vital Signs: BP 128/87 (BP Location: Right Arm)   Pulse (!) 103   Temp 98 F (36.7 C) (Oral)   Resp 16   Ht 6' (1.829 m)   Wt 62.6 kg   SpO2 96%   BMI 18.72 kg/m  SpO2: SpO2: 96 % O2 Device: O2 Device: Room Air O2 Flow Rate:    Intake/output summary:   Intake/Output Summary (Last 24 hours) at 11/11/2018 1239 Last data filed at 11/11/2018 0456 Gross per 24 hour  Intake 480 ml  Output 2300 ml  Net -1820 ml    LBM: Last BM Date: 11/09/18 Baseline Weight: Weight: 62.6 kg Most recent weight: Weight: 62.6 kg       Palliative Assessment/Data:    Flowsheet Rows     Most Recent Value  Intake Tab  Referral Department  Hospitalist  Unit at Time of Referral  Med/Surg Unit  Palliative Care Primary Diagnosis  Cancer [Uncontrolled pain]  Date Notified  11/07/18  Palliative Care Type  New Palliative care  Reason for referral  Pain, Counsel Regarding Hospice, Non-pain  Symptom, End of Life Care Assistance, Clarify Goals of Care  Date of Admission  11/07/18  Date first seen by Palliative Care  11/08/18  # of days Palliative referral response time  1 Day(s)  # of days IP prior to Palliative referral  0  Clinical Assessment  Palliative Performance Scale Score  20%  Pain Max last 24 hours  10  Pain Min Last 24 hours  8  Dyspnea Max Last 24 Hours  0  Dyspnea Min Last 24 hours  0  Nausea Max Last 24 Hours  5  Nausea Min Last 24 Hours  0  Anxiety Max Last 24 Hours  5  Anxiety Min Last 24 Hours  0  Psychosocial & Spiritual Assessment  Social Work Plan of Care  -- [end-of-life, [place] Outcomes  Patient/Family meeting held?  Yes  Who was at the meeting?  [name] (wife) and the patient  Palliative Care Outcomes  Improved pain interventions, Clarified goals of care, Counseled regarding hospice, Provided psychosocial or spiritual support, Changed to focus on comfort, Transitioned to hospice  Other Treatment Preference Instructions  anxiety, pain, comfort at EOL      Patient Active Problem List   Diagnosis Date Noted  . Intractable pain 11/08/2018  . Pancytopenia ([place]) 11/07/2018  . Rhabdomyosarcoma ([place]) 07/15/2018  . Gastroesophageal reflux disease 07/15/2018  . Bone metastases ([place]) 07/15/2018  . Malnutrition of moderate degree ([place]) 07/15/2018  . Atypical chest pain 07/15/2018  . Acute pain of right shoulder 07/15/2018  . Nasal polyposis 06/20/2017    Palliative  Care Assessment & Plan   Patient Profile:    Assessment:  Intractable pain Pancytopenia AKI bilateral hydronephrosis Metastatic sinonasal/parameningeal rhabdomyosarcoma  Recommendations/Plan:  Family meeting with wife at bedside. I met with her, discussed with wife, patient, also discussed with hospice liaison Erling Conte as well. Patient's wife was noted to be tearful, she was asking about prognosis, about use of IV opioids for adequate pain management. We talked about patient's current condition and his underlying serious irreversible illness.  Patient's prognosis appears to be less than 2 weeks, PO intake is minimal, almost nil, he is requiring IV Dilaudid drip.  We talked in detail about the type of care that is provided in a residential hospice.  All of the patient's wife's questions answered to the best of my ability.  She is willing to proceed with residential hospice.    Goals of Care and Additional Recommendations:  Limitations on Scope of Treatment: Full Comfort Care  Code Status:    Code Status Orders  (From admission, onward)         Start     Ordered   11/08/18 1444  Do not attempt resuscitation (DNR)  Continuous    Question Answer Comment  In the event of cardiac or respiratory ARREST Do not call a "code blue"   In the event of cardiac or respiratory ARREST Do not perform Intubation, CPR, defibrillation or ACLS   In the event of cardiac or respiratory ARREST Use medication by any route, position, wound care, and other measures to relive pain and suffering. May use oxygen, suction and manual treatment of airway obstruction as needed for comfort.      11/08/18 1504        Code Status History    Date Active Date Inactive Code Status Order ID Comments User Context   11/07/2018 1701 11/08/2018 1504 DNR [id number]  Hosie Poisson, MD ED   Advance Care Planning Activity  Advance Directive Documentation     Most Recent Value  Type of Advance Directive  Out of  facility DNR (pink MOST or yellow form)  Pre-existing out of facility DNR order (yellow form or pink MOST form)  Yellow form placed in chart (order not valid for inpatient use)  "MOST" Form in Place?  -       Prognosis:   < 2 weeks  Discharge Planning:  Hospice facility  Care plan was discussed with  Patient.   Thank you for allowing the Palliative Medicine Team to assist in the care of this patient.   Time In: 11 Time Out: 12.05 Total Time 65 Prolonged Time Billed  yes       Greater than 50%  of this time was spent counseling and coordinating care related to the above assessment and plan.  Loistine Chance, MD [phone]  Please contact Palliative Medicine Team phone at [phone] for questions and concerns.

## 2018-11-11 NOTE — Discharge Summary (Signed)
Physician Discharge Summary  Bruce Lynch [id number] DOB: August 19, 1970 DOA: 11/07/2018  PCP: Donella Stade, PA-C  Admit date: 11/07/2018 Discharge date: 11/11/2018  Admitted From: Home  Discharge disposition: residential hospice   Recommendations for Outpatient Follow-Up:   As per hospice  Discharge Diagnosis:   Active Problems:   Pancytopenia ([place])   Intractable pain   Rectal bleeding   Palliative care by specialist   Goals of care, counseling/discussion   Dying care   Discharge Condition: Stable  Diet recommendation:   Regular.  Wound care: None.  Code status: DNR   History of Present Illness:   Bruce Lynch a 48 y.o.malewith medical history significant ofmetastatic sinonasal/parameningeal rhabdomyosarcoma follows up with Dr. Caleen Jobs, was on hospice until the morning of presentation was brought by his wife to [facility] for abdominal discomfort and rectal bleeding. Patient is a poor historian and he was in moderate distress from neck pain and most of the history was available from the patient's wife at bedside.As per the wife, patient had been in pain and had been clinically deteriorating over the last 2 weeks, with minimal oral intake and unable to urinate. She reports hospice have not been able to help him relieve his pain, so she brought the patient to the hospital.   On arrival to the ED, patient was afebrile tachycardic slightly hypertensive with blood pressure of 144over 98. Source significant for sodium of 130, potassium of 5.3, chloride of 88 bicarb of 19 BUN of 183, creatinine of 6.75, AST of 44 RBC of 6.4, WBC of 3.3, platelets of 10, COVID-19 screening test negative. CT of the abdomen and pelvis without contrast showedAsymmetric soft tissue density involving the lower right anterior chest wall and anterior fourth rib, with involvement of the anterior pleural surface, concerning for metastatic disease. Longstanding right UPJ  obstruction with severe right hydronephrosis and essentially absent right renal parenchyma. Moderate left hydroureteronephrosis without definite obstructing lesion. Given markedly distended bladder, findings are likely related to urinary retention. X-ray showed moderate right pleural effusion.   Hospital Course:   Following conditions were addressed during hospitalization,  Metastatic sinonasal/parameningeal rhabdomyosarcoma. Currently patient is on home hospice but due to continued pain and difficulty controlling his symptoms, patient has been accepted for residential hospice.  Pain management as per hospice .  Pancytopenia, rectal bleed.    No plan for blood transfusion or further work-up   AKI with bilateral hydronephrosis.  Continue Foley catheter.  No further blood work-up performed.  Pancytopenia probably secondary to metastatic cancer/chemo.  No further blood work or GI intervention planned. Transition to residential hospice   Intractable pain, palliative care consulted.  Patient is on MS Contin at home home including oxycodone.  Also receive Dilaudid IV 1 to 2 mg every 2 hours for adequate pain control and hospitalization.  Further pain management as per hospice.  Disposition.  At this time, patient is stable for disposition to residential hospice.  Spoke with the patient's wife at bedside.  Spoke with social services/case management.   Medical Consultants:   palliative care   Subjective:   Today, patient feels okay with his pain which is under control..  Denies any nausea vomiting shortness of breath chest pain or palpitation.  Discharge Exam:   Vitals:   11/08/18 1526 11/09/18 1337  BP: 131/76 128/87  Pulse: 96 (!) 103  Resp: 10 16  Temp: 97.8 F (36.6 C) 98 F (36.7 C)  SpO2: 97% 96%   Vitals:   11/07/18 2123 11/08/18  0514 11/08/18 1526 11/09/18 1337  BP: (!) 142/96 (!) 132/94 131/76 128/87  Pulse: (!) 102 (!) 102 96 (!) 103  Resp: 16 20 10 16   Temp: 98  F (36.7 C) 98.6 F (37 C) 97.8 F (36.6 C) 98 F (36.7 C)  TempSrc: Oral Oral Oral Oral  SpO2:  97% 97% 96%  Weight:      Height:        General exam: Appears calm and comfortable ,Not in distress, thinly built. HEENT:PERRL,Oral mucosa moist Respiratory system: Bilateral equal air entry, normal vesicular breath sounds, no wheezes or crackles.  Chest wall Port-A-Cath in place. Cardiovascular system: S1 & S2 heard, RRR.  Gastrointestinal system: Abdomen is nondistended, soft and nontender. No organomegaly or masses felt. Normal bowel sounds heard.  Foley catheter in place. Central nervous system: Alert and oriented. No focal neurological deficits. Extremities: No edema, no clubbing ,no cyanosis, distal peripheral pulses palpable. Skin: No rashes, lesions or ulcers,no icterus ,no pallor MSK: Normal muscle bulk,tone ,power    Procedures:    none  The results of significant diagnostics from this hospitalization (including imaging, microbiology, ancillary and laboratory) are listed below for reference.     Diagnostic Studies:   Ct Abdomen Pelvis Wo Contrast  Result Date: 11/07/2018 CLINICAL DATA:  Rectal bleeding. 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 EXAM: CT ABDOMEN AND PELVIS WITHOUT CONTRAST TECHNIQUE: Multidetector CT imaging of the abdomen and pelvis was performed following the standard protocol without IV contrast. COMPARISON:  None. FINDINGS: Lower chest: Partially visualized small right anterior basal pneumothorax. Partially visualized moderate right pleural effusion with partial collapse of the right lower lobe. Asymmetric soft tissue density involving the lower right anterior chest wall adjacent to the anterior fourth rib, with cortical irregularity of the rib and involvement of the anterior pleural surface (series 2, image 2). Hepatobiliary: No focal liver abnormality. No gallbladder wall thickening, distention, or surrounding inflammatory changes. Pancreas:  Unremarkable. No pancreatic ductal dilatation or surrounding inflammatory changes. Spleen: Atrophic.  No focal abnormality. Adrenals/Urinary Tract: Adrenal glands are unremarkable. Severe right hydronephrosis to the level of the UPJ with minimal residual right renal parenchyma. Moderate left hydroureteronephrosis without obstructing calculi. 4 mm nonobstructive calculus in the midpole of the left kidney. Markedly distended bladder with a few diverticula. Stomach/Bowel: Stomach is within normal limits. Appendix is not identified. No evidence of bowel wall thickening, distention, or inflammatory changes. Moderately increased colonic stool burden. Vascular/Lymphatic: Aortic atherosclerosis. No enlarged abdominal or pelvic lymph nodes. Reproductive: Prostate is normal in size.  Possible TURP defect. Other: No abdominal wall hernia or abnormality. No abdominopelvic ascites. No pneumoperitoneum. Musculoskeletal: Patchy sclerosis throughout the visualized thoracolumbar spine which may be related to chemotherapy. 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 IMPRESSION: 1. Partially visualized small right anterior basal pneumothorax and moderate right pleural effusion. 2. Asymmetric soft tissue density involving the lower right anterior chest wall and anterior fourth rib, with involvement of the anterior pleural surface, concerning for metastatic disease. 3. 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 4. Moderate left hydroureteronephrosis without definite obstructing lesion. Given markedly distended bladder, findings are likely related to urinary retention. 5. Nonobstructive left nephrolithiasis. 6. Moderately increased colonic stool burden.  No obstruction. 7.  Aortic atherosclerosis (ICD10-I70.0). Electronically Signed   By: Titus Dubin M.D.   On: 11/07/2018 13:26   Dg Chest Port 1 View  Result Date:  11/07/2018 CLINICAL DATA:  Rectal bleeding. EXAM: PORTABLE CHEST  1 VIEW COMPARISON:  June 19, 2018 FINDINGS: There is a stable left-sided Port-A-Cath terminating in the duplicated SVC. There is a probable skin fold over the lateral upper chest no convincing evidence of pneumothorax. There is haziness over the right lower lung. There appears to be some pleural thickening laterally in the right upper chest. The cardiomediastinal silhouette is stable. The left lung is clear. IMPRESSION: 1. Haziness over the right chest is new and could represent loculated fluid, an underlying pulmonary infiltrate, or something on the patient or in the chest wall. There is also some pleural thickening laterally in the right upper chest. A PA and lateral chest x-ray may better evaluate. Alternatively, a CT scan could further evaluate. 2. There is a skin fold over the upper right lateral chest but no convincing evidence of pneumothorax. 3. No other acute abnormalities. Electronically Signed   By: Dorise Bullion III M.D   On: 11/07/2018 14:02     Labs:   Basic Metabolic Panel: Recent Labs  Lab 11/07/18 1021  NA 130*  K 5.3*  CL 88*  CO2 19*  GLUCOSE 149*  BUN 183*  CREATININE 6.75*  CALCIUM 9.5   GFR Estimated Creatinine Clearance: 11.6 mL/min (A) (by C-G formula based on SCr of 6.75 mg/dL (H)). Liver Function Tests: Recent Labs  Lab 11/07/18 1021  AST 44*  ALT 11  ALKPHOS 63  BILITOT 0.4  PROT 7.7  ALBUMIN 3.8   Recent Labs  Lab 11/07/18 1021  LIPASE 35   No results for input(s): AMMONIA in the last 168 hours. Coagulation profile Recent Labs  Lab 11/08/18 0822  INR 1.1    CBC: Recent Labs  Lab 11/07/18 1021 11/08/18 0822 11/08/18 1040  WBC 3.3*  --  2.4*  NEUTROABS 2.3  --   --   HGB 6.4*  --  7.5*  HCT 19.4*  --  22.2*  MCV 100.0  --  90.2  PLT 10* 45* 44*   Cardiac Enzymes: No results for input(s): CKTOTAL, CKMB, CKMBINDEX, TROPONINI in the last 168 hours. BNP: Invalid  input(s): POCBNP CBG: No results for input(s): GLUCAP in the last 168 hours. D-Dimer No results for input(s): DDIMER in the last 72 hours. Hgb A1c No results for input(s): HGBA1C in the last 72 hours. Lipid Profile No results for input(s): CHOL, HDL, LDLCALC, TRIG, CHOLHDL, LDLDIRECT in the last 72 hours. Thyroid function studies No results for input(s): TSH, T4TOTAL, T3FREE, THYROIDAB in the last 72 hours.  Invalid input(s): FREET3 Anemia work up No results for input(s): VITAMINB12, FOLATE, FERRITIN, TIBC, IRON, RETICCTPCT in the last 72 hours. Microbiology Recent Results (from the past 240 hour(s))  SARS Coronavirus 2 [facility] order, Performed in [facility] hospital lab) Nasopharyngeal Nasopharyngeal Swab     Status: None   Collection Time: 11/07/18  1:36 PM   Specimen: Nasopharyngeal Swab  Result Value Ref Range Status   SARS Coronavirus 2 NEGATIVE NEGATIVE Final    Comment: (NOTE) If result is NEGATIVE SARS-CoV-2 target nucleic acids are NOT DETECTED. The SARS-CoV-2 RNA is generally detectable in upper and lower  respiratory specimens during the acute phase of infection. The lowest  concentration of SARS-CoV-2 viral copies this assay can detect is 250  copies / mL. A negative result does not preclude SARS-CoV-2 infection  and should not be used as the sole basis for treatment or other  patient management decisions.  A negative result may occur with  improper specimen collection / handling, submission of specimen other  than nasopharyngeal swab, presence of viral mutation(s) within the  areas targeted by this assay, and inadequate number of viral copies  (<250 copies / mL). A negative result must be combined with clinical  observations, patient history, and epidemiological information. If result is POSITIVE SARS-CoV-2 target nucleic acids are DETECTED. The SARS-CoV-2 RNA is generally detectable in upper and lower  respiratory specimens dur ing the acute phase of  infection.  Positive  results are indicative of active infection with SARS-CoV-2.  Clinical  correlation with patient history and other diagnostic information is  necessary to determine patient infection status.  Positive results do  not rule out bacterial infection or co-infection with other viruses. If result is PRESUMPTIVE POSTIVE SARS-CoV-2 nucleic acids MAY BE PRESENT.   A presumptive positive result was obtained on the submitted specimen  and confirmed on repeat testing.  While 2019 novel coronavirus  (SARS-CoV-2) nucleic acids may be present in the submitted sample  additional confirmatory testing may be necessary for epidemiological  and / or clinical management purposes  to differentiate between  SARS-CoV-2 and other Sarbecovirus currently known to infect humans.  If clinically indicated additional testing with an alternate test  methodology [phone]) is advised. The SARS-CoV-2 RNA is generally  detectable in upper and lower respiratory sp ecimens during the acute  phase of infection. The expected result is Negative. Fact Sheet for Patients:  StrictlyIdeas.no Fact Sheet for Healthcare Providers: BankingDealers.co.za This test is not yet approved or cleared by the [place] FDA and has been authorized for detection and/or diagnosis of SARS-CoV-2 by FDA under an Emergency Use Authorization (EUA).  This EUA will remain in effect (meaning this test can be used) for the duration of the COVID-19 declaration under Section 564(b)(1) of the Act, 21 U.S.C. section 360bbb-3(b)(1), unless the authorization is terminated or revoked sooner. Performed at [facility], [place] [address] [place], [place] [address]      Discharge Instructions:   Discharge Instructions    Diet general   Complete by: As directed    Discharge instructions   Complete by: As directed    Follow-up as per residential hospice   Increase  activity slowly   Complete by: As directed      Allergies as of 11/11/2018   No Known Allergies     Medication List    TAKE these medications   antiseptic oral rinse Liqd Apply 15 mLs topically as needed for dry mouth.   bisacodyl 10 MG suppository Commonly known as: DULCOLAX Place 1 suppository (10 mg total) rectally daily as needed for moderate constipation.   gabapentin 300 MG capsule Commonly known as: NEURONTIN Take 300 mg by mouth daily as needed (nerve pain/tingling).   lidocaine 5 % Commonly known as: LIDODERM Place 1 patch onto the skin daily as needed (pain).   morphine 60 MG 12 hr tablet Commonly known as: MS CONTIN Take 60 mg by mouth every 12 (twelve) hours as needed for pain.   ondansetron 4 MG disintegrating tablet Commonly known as: ZOFRAN-ODT Take 4 mg by mouth every 8 (eight) hours as needed for nausea or vomiting.   oxyCODONE 5 MG immediate release tablet Commonly known as: Oxy IR/ROXICODONE Take 5-10 mg by mouth every 4 (four) hours as needed for pain.   senna-docusate 8.6-50 MG tablet Commonly known as: Senokot-S Take 1 tablet by mouth 2 (two) times daily.       Time coordinating discharge: 39 minutes  Signed:  Nicoli Nardozzi  [facility] 11/11/2018,  1:25 PM

## 2018-11-11 NOTE — Progress Notes (Signed)
Report called to Arbie Cookey at [facility]. Patient transported via [facility].

## 2018-11-11 NOTE — TOC Transition Note (Signed)
Transition of Care [facility]) - CM/SW Discharge Note   Patient Details  Name: Bruce Lynch MRN: [id number] Date of Birth: 10/03/1970  Transition [facility]) CM/SW Contact:  Nila Nephew, LCSW Phone Number: [phone] 11/11/2018, 1:40 PM   Clinical Narrative:   Pt admitting to [facility] today for hospice care. Pt's wife has completed paperwork for admission and CSW will arrange transport there. Report [phone]          Patient Goals and CMS Choice Patient states their goals for this hospitalization and ongoing recovery are:: hospice care CMS Medicare.gov Compare Post Acute Care list provided to:: (pt's wife) Choice offered to / list presented to : Spouse  Discharge Placement               [facility]        Discharge Plan and Services In-house Referral: Clinical Social Work                                   Social Determinants of Health (SDOH) Interventions     Readmission Risk Interventions No flowsheet data found.

## 2018-12-10 DEATH — deceased

## 2021-07-07 IMAGING — CT CT ABDOMEN AND PELVIS WITHOUT CONTRAST
2 of 5 series · 14 of 46 positions shown, 16 images · non-contrast
Comparison: None.

CLINICAL DATA: Rectal bleeding. Currently on chemotherapy for
sinonasal tumor.

EXAM:
CT ABDOMEN AND PELVIS WITHOUT CONTRAST
TECHNIQUE: Multidetector CT imaging of the abdomen and pelvis was performed
following the standard protocol without IV contrast.

[Series 2: axial st · axial · 0.77mm/px · z∈[+1006,+1446]mm · 11 of 102 slices shown, 13 images]
[im 7/102  soft-tissue]
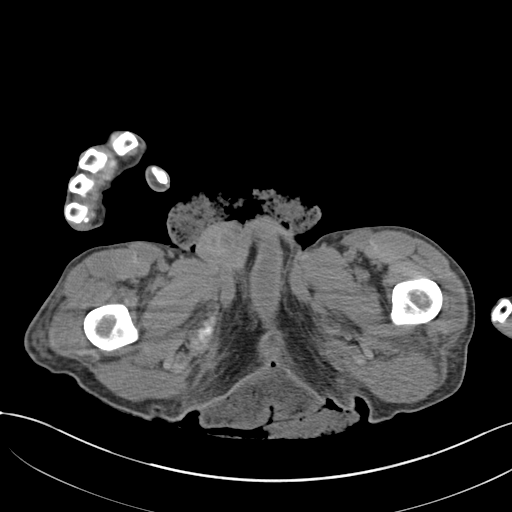
[im 7/102  bone]
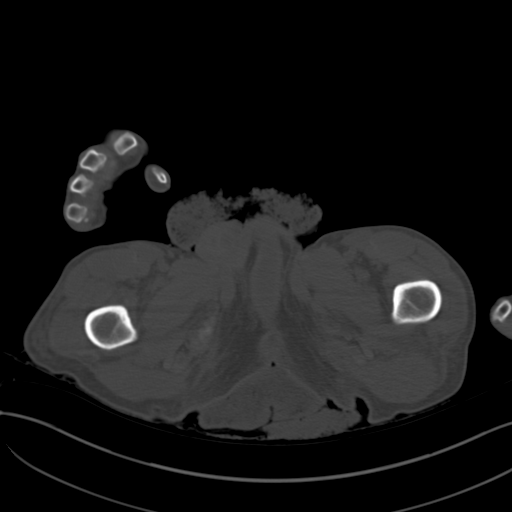
[im 14/102  soft-tissue]
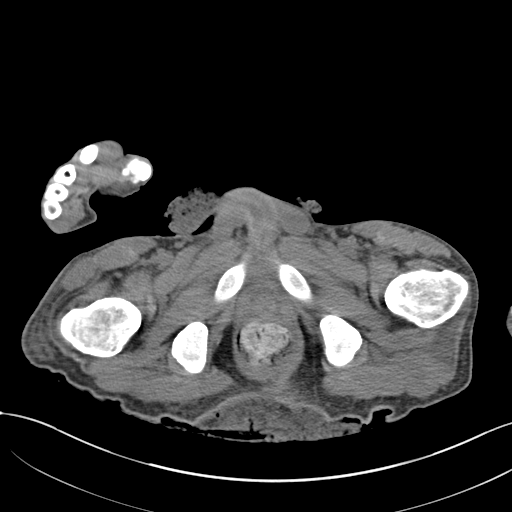
[im 27/102  soft-tissue]
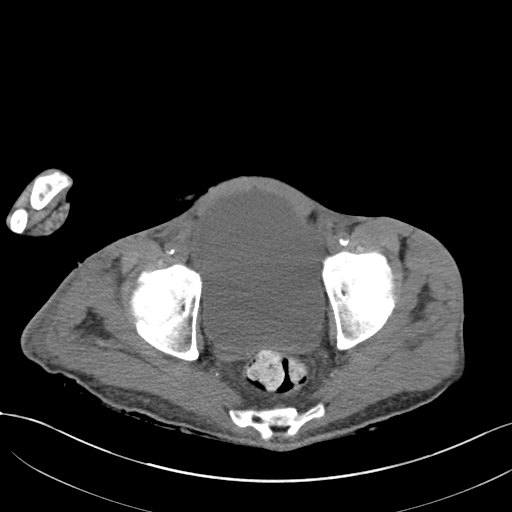
[im 34/102  soft-tissue]
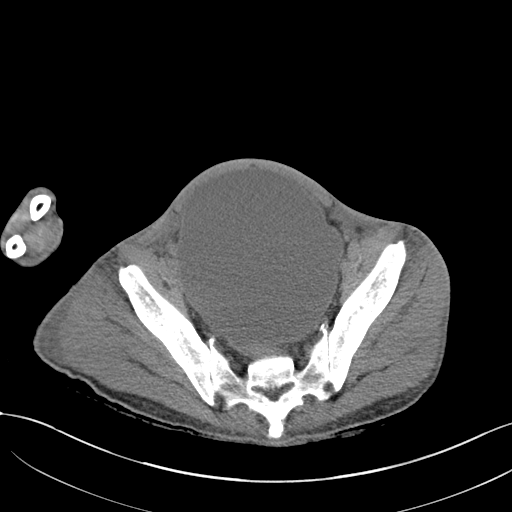
[im 41/102  soft-tissue]
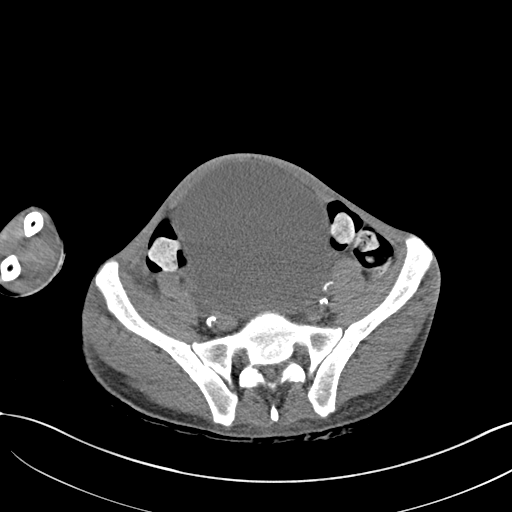
[im 54/102  soft-tissue]
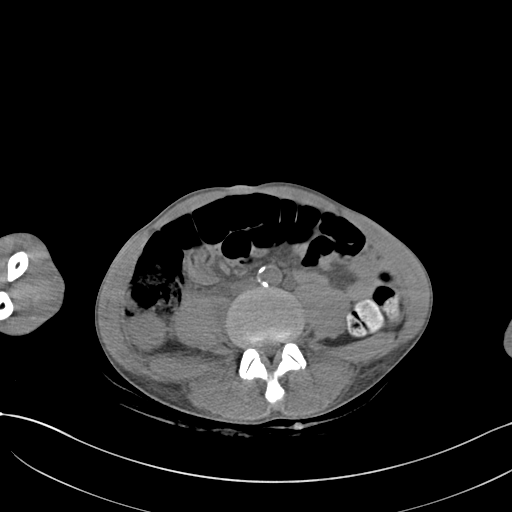
[im 61/102  soft-tissue]
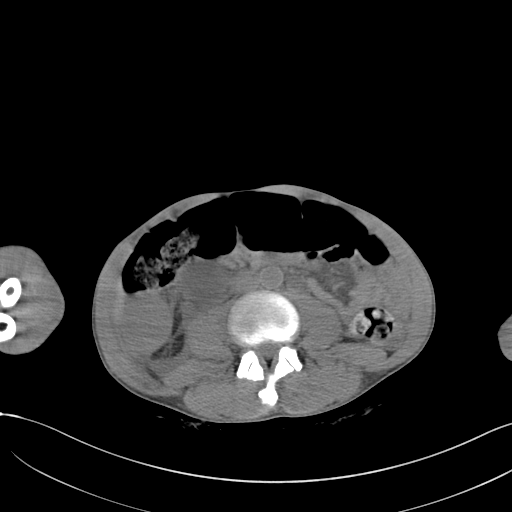
[im 68/102  soft-tissue]
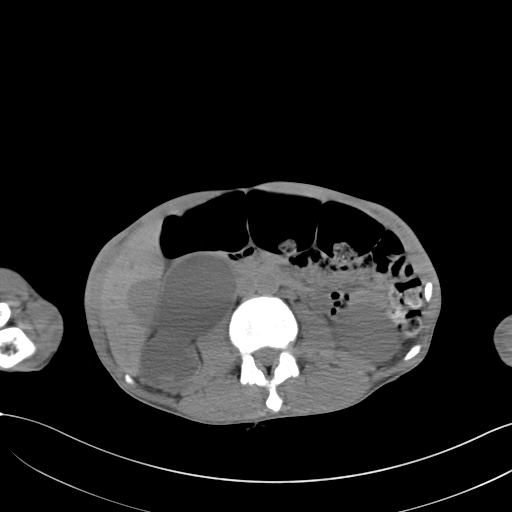
[im 75/102  soft-tissue]
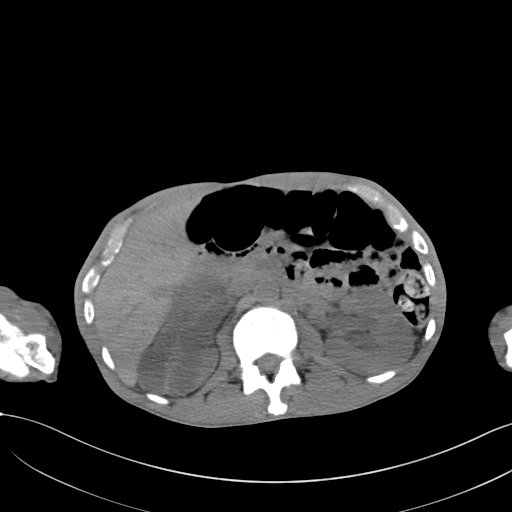
[im 75/102  bone]
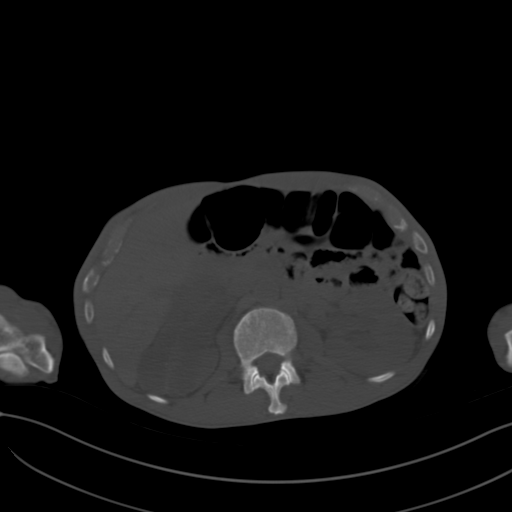
[im 88/102  soft-tissue]
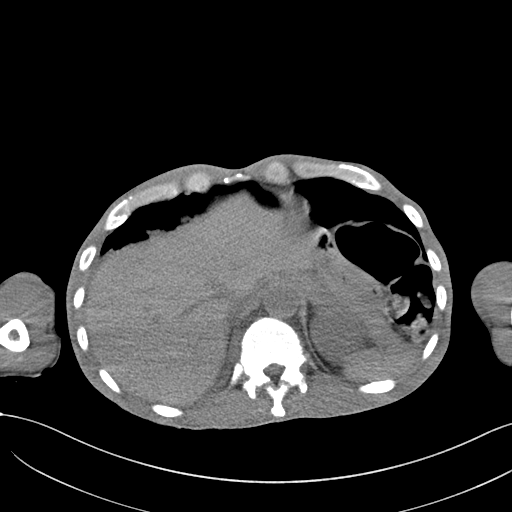
[im 95/102  soft-tissue]
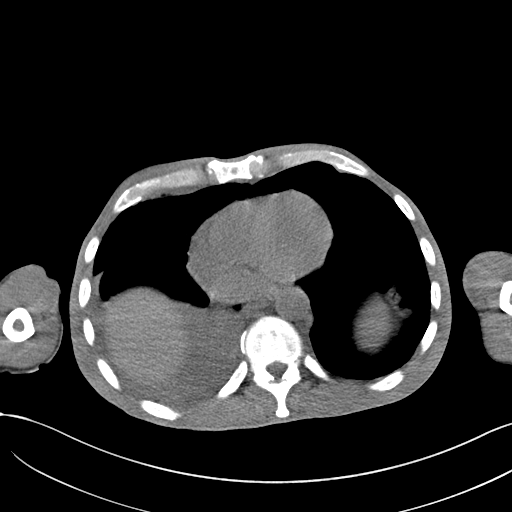

[Series 6: coronal st · coronal · 0.67mm/px · 3 of 115 slices shown]
[im 39/115  soft-tissue]
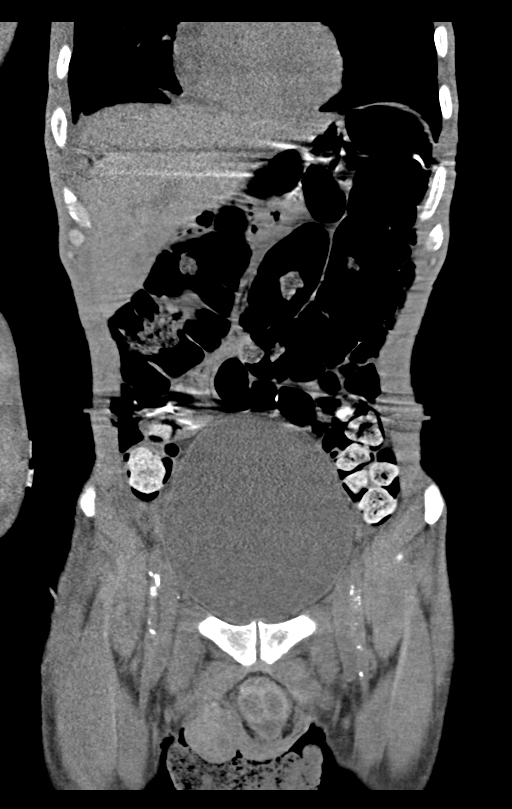
[im 51/115  soft-tissue]
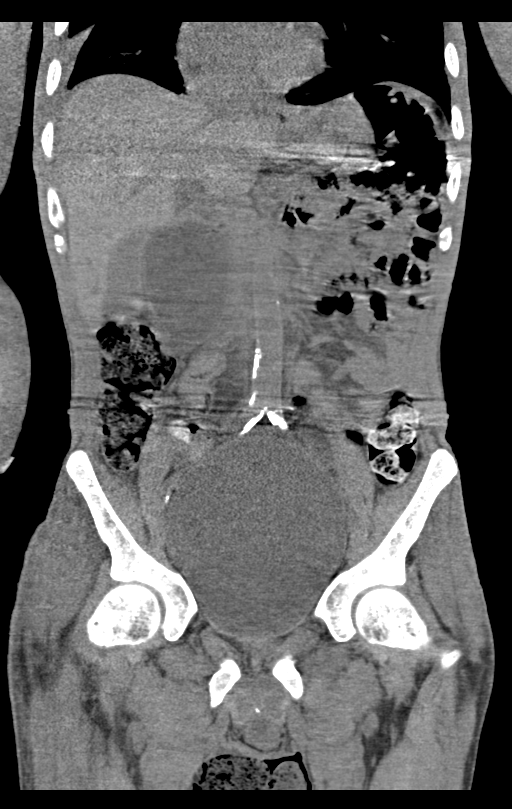
[im 64/115  soft-tissue]
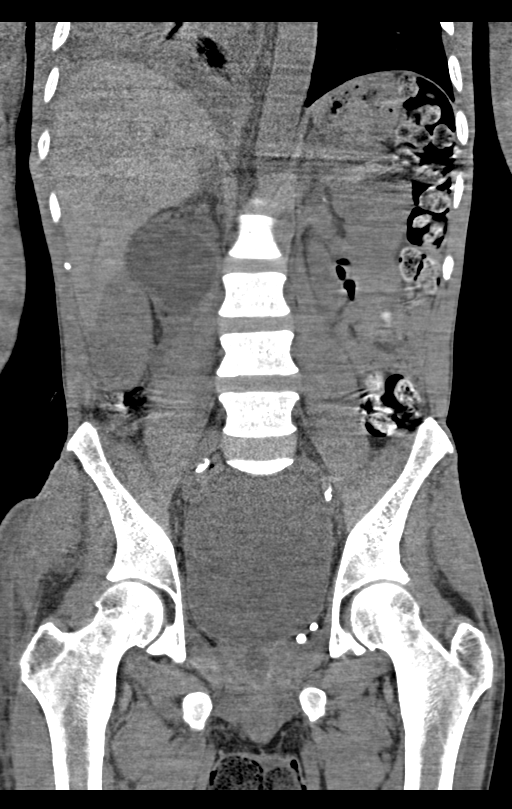

[14 of 46 positions shown; findings below may reference images not displayed]

FINDINGS: Lower chest: Partially visualized small right anterior basal
pneumothorax. Partially visualized moderate right pleural effusion
with partial collapse of the right lower lobe. Asymmetric soft
tissue density involving the lower right anterior chest wall
adjacent to the anterior fourth rib, with cortical irregularity of
the rib and involvement of the anterior pleural surface (series 2,
image 2).

Hepatobiliary: No focal liver abnormality. No gallbladder wall
thickening, distention, or surrounding inflammatory changes.

Pancreas: Unremarkable. No pancreatic ductal dilatation or
surrounding inflammatory changes.

Spleen: Atrophic.  No focal abnormality.

Adrenals/Urinary Tract: Adrenal glands are unremarkable. Severe
right hydronephrosis to the level of the UPJ with minimal residual
right renal parenchyma. Moderate left hydroureteronephrosis without
obstructing calculi. 4 mm nonobstructive calculus in the midpole of
the left kidney. Markedly distended bladder with a few diverticula.

Stomach/Bowel: Stomach is within normal limits. Appendix is not
identified. No evidence of bowel wall thickening, distention, or
inflammatory changes. Moderately increased colonic stool burden.

Vascular/Lymphatic: Aortic atherosclerosis. No enlarged abdominal or
pelvic lymph nodes.

Reproductive: Prostate is normal in size.  Possible TURP defect.

Other: No abdominal wall hernia or abnormality. No abdominopelvic
ascites. No pneumoperitoneum.

Musculoskeletal: Patchy sclerosis throughout the visualized
thoracolumbar spine which may be related to chemotherapy. Early
avascular necrosis of the right and possibly left femoral heads.
IMPRESSION: 1. Partially visualized small right anterior basal pneumothorax and
moderate right pleural effusion.
2. Asymmetric soft tissue density involving the lower right anterior
chest wall and anterior fourth rib, with involvement of the anterior
pleural surface, concerning for metastatic disease.
3. Longstanding right UPJ obstruction with severe right
hydronephrosis and essentially absent right renal parenchyma.
4. Moderate left hydroureteronephrosis without definite obstructing
lesion. Given markedly distended bladder, findings are likely
related to urinary retention.
5. Nonobstructive left nephrolithiasis.
6. Moderately increased colonic stool burden.  No obstruction.
7.  Aortic atherosclerosis ([id number]).
